# Patient Record
Sex: Female | Born: 1960 | Race: White | Hispanic: No | Smoking: Current every day smoker
Health system: Southern US, Community
[De-identification: ages and names within clinical notes are randomized; demographics above are authoritative.]

## PROBLEM LIST (undated history)

## (undated) DIAGNOSIS — G35 Multiple sclerosis: Secondary | ICD-10-CM

## (undated) DIAGNOSIS — I10 Essential (primary) hypertension: Secondary | ICD-10-CM

## (undated) DIAGNOSIS — G43909 Migraine, unspecified, not intractable, without status migrainosus: Secondary | ICD-10-CM

## (undated) DIAGNOSIS — E785 Hyperlipidemia, unspecified: Secondary | ICD-10-CM

## (undated) HISTORY — DX: Multiple sclerosis: G35

## (undated) HISTORY — DX: Essential (primary) hypertension: I10

## (undated) HISTORY — DX: Migraine, unspecified, not intractable, without status migrainosus: G43.909

## (undated) HISTORY — DX: Hyperlipidemia, unspecified: E78.5

---

## 2019-01-13 ENCOUNTER — Ambulatory Visit (INDEPENDENT_AMBULATORY_CARE_PROVIDER_SITE_OTHER): Payer: Medicare Other | Admitting: Neurology

## 2019-01-13 ENCOUNTER — Telehealth: Payer: Self-pay | Admitting: Neurology

## 2019-01-13 ENCOUNTER — Encounter: Payer: Self-pay | Admitting: Neurology

## 2019-01-13 ENCOUNTER — Other Ambulatory Visit: Payer: Self-pay

## 2019-01-13 VITALS — BP 101/60 | HR 58 | Temp 96.4°F | Ht 62.0 in | Wt 189.5 lb

## 2019-01-13 DIAGNOSIS — R5383 Other fatigue: Secondary | ICD-10-CM | POA: Diagnosis not present

## 2019-01-13 DIAGNOSIS — I10 Essential (primary) hypertension: Secondary | ICD-10-CM | POA: Diagnosis not present

## 2019-01-13 DIAGNOSIS — F418 Other specified anxiety disorders: Secondary | ICD-10-CM

## 2019-01-13 DIAGNOSIS — I639 Cerebral infarction, unspecified: Secondary | ICD-10-CM | POA: Insufficient documentation

## 2019-01-13 DIAGNOSIS — G35 Multiple sclerosis: Secondary | ICD-10-CM | POA: Diagnosis not present

## 2019-01-13 DIAGNOSIS — N3941 Urge incontinence: Secondary | ICD-10-CM

## 2019-01-13 MED ORDER — MODAFINIL 200 MG PO TABS: 200.0000 mg | ORAL_TABLET | Freq: Two times a day (BID) | ORAL | 5 refills | Status: DC

## 2019-01-13 MED ORDER — SOLIFENACIN SUCCINATE 5 MG PO TABS: 5.0000 mg | ORAL_TABLET | Freq: Every day | ORAL | 11 refills | Status: DC

## 2019-01-13 MED ORDER — DULOXETINE HCL 60 MG PO CPEP: 60.0000 mg | ORAL_CAPSULE | Freq: Every day | ORAL | 11 refills | Status: DC

## 2019-01-13 NOTE — Progress Notes (Signed)
[facility]  PATIENT: Yvonne LoreSuzanne Hubner DOB: 03/26/1961  REFERRING DOCTOR OR PCP:  Erlene SentersKenneth Osborn (fax: [phone]) SOURCE: patient, notes from Dr. Arline AspKinkel, will review MRI if available  _________________________________   HISTORICAL  CHIEF COMPLAINT:  Chief Complaint  Patient presents with   New Patient (Initial Visit)    RM 13, alone. Paper referral for MS. She is from [place]. She was dx with MS about 14 years ago. Not on DMT. She was on copaxone. She was on it for several years but had breathing issues on medication. She stopped d/t not being symptomatic and her neurologist in [place] had her stop medication.     HISTORY OF PRESENT ILLNESS:  I had the pleasure of seeing patient, Yvonne Page, [facility] for neurologic consultation regarding her diagnosis of multiple sclerosis.  Yvonne LoreSuzanne Outman is a 58 yo woman who was diagnosed with MS about 2006.   She reports at age 89 (1988), she had optic neuritis but the MRI of the brain reportedly showed only one spot.   She was told she had a 2/3 chance of developing MS.    Around 2006, she had vertigo.   An MRI was performed showing more foci and she was diagnosed with MS. she was referred to Dr. Arline AspKinkel.   She was on Copaxone for several years but stopped due to shortness of breath.   She has not been on any DMT for the past 7 years or so.   During that time, she has had some intermittent symptoms of numbness, never lasting over 1 day.  Additionally she has noted fatigue has worsened and her gait is sometimes off balance.  She was told around 7 years ago that she had a stroke on the MRI.   However, she never had any clear sudden onset symptom.  [place] moved to [place] in November [place].    [place] between [place] and [place].  Currently, she is walking without any aid.   Strength is fine though the wrist sometime seem weak.   She has some intermittent numbness in  her limbs but no fixed numbness.   She reports reduced vision OS since the optic neuritis.   She also notes color desaturation OS.   She continues to report dizziness.   She reports urinary urge incontinence at times.  She has depression and is on fluoxetine.   She has been on a different antidepressant in the past that worked better but does not recall the name.   She has fatigue daily and it is both physical and cognitive.    She is on modafinil 200 mg daily.    In the past, she was on 200 mg twice daily with better results.   She reports reduced cognition issues with reduced word finding and short term memory.     She reports pain in her elbows and wrists.  Pain can be severe at times.   It fluctuates.    Vascular risks:  Hypertension.  Smokes 1/2 ppd, Hyperlipidemia.   She does not recall having an Echo or carotid doppler.  She takes an 81 mg aspirin daily  MRI of the brain and cervical spine performed 11/11/2014 were personally reviewed.  The MRI of the brain shows a left temporal parietal stroke involving gray and white matter.  This could be embolic in origin.  Additionally, there is a second stroke adjacent to the caudate the corona radiata on  the left.  There are a few T2/flair hyperintense foci, one is juxtacortical on the left and others are subcortical deep white matter or pericallosal.  There are no foci in the posterior fossa or the spinal cord on the cervical spine MRI.  However, the cervical spine MRI does show mild spinal stenosis at C5-C6 associated with moderately severe foraminal narrowing.  REVIEW OF SYSTEMS: Constitutional: No fevers, chills, sweats, or change in appetite.  She has fatigue Eyes: No visual changes, double vision, eye pain Ear, nose and throat: No hearing loss, ear pain, nasal congestion, sore throat.  She has dizziness/vertigo. Cardiovascular: No chest pain, palpitations Respiratory: No shortness of breath at rest or with exertion.   No wheezes GastrointestinaI:  No nausea, vomiting, diarrhea, abdominal pain, fecal incontinence Genitourinary: No dysuria, urinary retention or frequency.  No nocturia. Musculoskeletal: No neck pain, back pain Integumentary: No rash, pruritus, skin lesions Neurological: as above Psychiatric: No depression at this time.  No anxiety Endocrine: No palpitations, diaphoresis, change in appetite, change in weigh or increased thirst Hematologic/Lymphatic: No anemia, purpura, petechiae. Allergic/Immunologic: No itchy/runny eyes, nasal congestion, recent allergic reactions, rashes  ALLERGIES: Not on File  HOME MEDICATIONS:  Current Outpatient Medications:    ASPIRIN 81 PO, Take 1 tablet by mouth daily., Disp: , Rfl:    atorvastatin (LIPITOR) 20 MG tablet, Take 20 mg by mouth daily., Disp: , Rfl:    Cholecalciferol (VITAMIN D3 PO), Take 5,000 Units by mouth daily., Disp: , Rfl:    FLUoxetine (PROZAC) 20 MG capsule, Take 1 capsule by mouth daily., Disp: , Rfl:    lisinopril (ZESTRIL) 20 MG tablet, 1 TABLET IN AM AND 1/2 TAB AT BEDTIME DAILY ORALLY 90, Disp: , Rfl:    modafinil (PROVIGIL) 200 MG tablet, Take 200 mg by mouth 2 (two) times a day. , Disp: , Rfl:    propranolol (INDERAL) 80 MG tablet, Take 80 mg by mouth 2 (two) times daily., Disp: , Rfl:   PAST MEDICAL HISTORY: Past Medical History:  Diagnosis Date   Multiple sclerosis (HCC)     PAST SURGICAL HISTORY:  FAMILY HISTORY: No family history on file.  SOCIAL HISTORY:  Social History   Socioeconomic History   Marital status: Divorced    Spouse name: Not on file   Number of children: Not on file   Years of education: Not on file   Highest education level: Not on file  Occupational History   Not on file  Social Needs   Financial resource strain: Not on file   Food insecurity    Worry: Not on file    Inability: Not on file   Transportation needs    Medical: Not on file    Non-medical: Not on file  Tobacco Use   Smoking status:  Not on file  Substance and Sexual Activity   Alcohol use: Not on file   Drug use: Not on file   Sexual activity: Not on file  Lifestyle   Physical activity    Days per week: Not on file    Minutes per session: Not on file   Stress: Not on file  Relationships   Social connections    Talks on phone: Not on file    Gets together: Not on file    Attends religious service: Not on file    Active member of club or organization: Not on file    Attends meetings of clubs or organizations: Not on file    Relationship status: Not on  file   Intimate partner violence    Fear of current or ex partner: Not on file    Emotionally abused: Not on file    Physically abused: Not on file    Forced sexual activity: Not on file  Other Topics Concern   Not on file  Social History Narrative   Lives alone   Caffeine use: 2 cups every morning (coffee)   Right handed     PHYSICAL EXAM  Vitals:   01/13/19 0913  BP: 101/60  Pulse: (!) 58  Temp: (!) 96.4 F (35.8 C)  SpO2: 98%  Weight: 189 lb 8 oz (86 kg)  Height: 5\' 2"  (1.575 m)    Body mass index is 34.66 kg/m.   General: The patient is well-developed and well-nourished and in no acute distress  HEENT:  Head is Sellers/AT.  Sclera are anicteric.  Funduscopic exam shows normal optic discs and retinal vessels.  Neck: No carotid bruits are noted.  The neck is nontender.  Cardiovascular: The heart has a regular rate and rhythm with a normal S1 and S2. There were no murmurs, gallops or rubs.    Skin: Extremities are without rash or  edema.  Musculoskeletal:  Back is nontender  Neurologic Exam  Mental status: The patient is alert and oriented x 3 at the time of the examination. The patient has apparent normal recent and remote memory, with an apparently normal attention span and concentration ability.   Speech is normal.  Cranial nerves: Extraocular movements are full. Pupils are equal, round, and reactive to light and accomodation.   Color vision was slightly brighter on the right than the left..  Facial symmetry is present. There is good facial sensation to soft touch bilaterally.Facial strength is normal.  Trapezius and sternocleidomastoid strength is normal. No dysarthria is noted.  The tongue is midline, and the patient has symmetric elevation of the soft palate. No obvious hearing deficits are noted.  Motor:  Muscle bulk is normal.   Tone is normal. Strength is  5 / 5 in all 4 extremities.   Sensory: Sensory testing is reduced to touch and temperature on the left vs right.  Coordination: Cerebellar testing reveals good finger-nose-finger and heel-to-shin bilaterally.  Gait and station: Station is normal.   Gait is normal. Tandem gait is mildly wide.. Romberg is negative.   Reflexes: Deep tendon reflexes are symmetric and normal bilaterally.   Plantar responses are flexor.     ASSESSMENT AND PLAN  1. Multiple sclerosis (HCC)   2. Cerebrovascular accident (CVA), unspecified mechanism (HCC)   3. Essential hypertension   4. Other fatigue   5. Urge incontinence   6. Depression with anxiety     In summary, Ms. Roque is a 58 year old woman who was diagnosed with MS 13 or 14 years ago who has not been on any disease modifying therapy for many years.  I was able to review the MRI from 2016 but did not have earlier MRIs for comparison.  I am concerned that the MRI of the brain shows 2 strokes including a moderately large right temporoparietal stroke that could have an embolic etiology.  There is also a larger lacunar type stroke adjacent to the caudate nucleus in the white matter on the left.  There are several other lesions that are nonspecific and could represent demyelination from MS but could also be due to chronic microvascular ischemic change.  Given her history of optic neuritis and some foci on the MRI that could be  MS, it is probable that she has a mild multiple sclerosis.  She has not had a definite exacerbation  for many years off of her medication.  I am more concerned about the 2 strokes and further evaluation is necessary to determine if there is a source.  We will check an echocardiogram with bubble contrast to evaluate the valves and to determine if there is a significant PFO.  Additionally, carotid Doppler studies will be performed to rule out carotid stenosis.  She is advised to continue 81 mg aspirin daily.  We will check an MRI of the brain and cervical spine to determine if she has had additional lesions since 2016.  If this has occurred and is consistent with subclinical progression of the MS, we will consider a disease modifying therapy.  Otherwise, if the MRI is stable over 4 years, we could continue off of a disease modifying therapy as I am not certain about the diagnosis.  She will follow-up to see me in 3 months or sooner if there are new or worsening neurologic symptoms.  For her symptoms, I will increase the modafinil to 200 mg p.o. twice a day.  She had been on this dose in the past with better benefit.  Additionally I will stop the fluoxetine and switch her to duloxetine to see if we can get better control of the mood issues.  Vesicare 5 mg will be added for her bladder urgency.  Thank you for asking Ms. Lamke to see me.  Please let me know if I can be of further assistance with her or other patients in the future.  Keyundra Fant A. Felecia Shelling, MD, PhD, FAAN Certified in Neurology, Clinical Neurophysiology, Sleep Medicine, Pain Medicine and Neuroimaging Director, [place] at [facility] [address] [place], [place] [address] [phone]

## 2019-01-13 NOTE — Telephone Encounter (Signed)
[employer] medicare order sent to GI. No auth they will reach out to the patient to schedule.  

## 2019-01-16 ENCOUNTER — Other Ambulatory Visit: Payer: Self-pay | Admitting: *Deleted

## 2019-01-16 DIAGNOSIS — I639 Cerebral infarction, unspecified: Secondary | ICD-10-CM

## 2019-01-21 ENCOUNTER — Ambulatory Visit (HOSPITAL_COMMUNITY): Payer: Medicare Other | Attending: Cardiology

## 2019-01-21 ENCOUNTER — Other Ambulatory Visit: Payer: Self-pay

## 2019-01-21 DIAGNOSIS — I639 Cerebral infarction, unspecified: Secondary | ICD-10-CM

## 2019-01-21 DIAGNOSIS — I10 Essential (primary) hypertension: Secondary | ICD-10-CM

## 2019-01-21 DIAGNOSIS — F17200 Nicotine dependence, unspecified, uncomplicated: Secondary | ICD-10-CM | POA: Diagnosis not present

## 2019-01-21 DIAGNOSIS — I081 Rheumatic disorders of both mitral and tricuspid valves: Secondary | ICD-10-CM | POA: Insufficient documentation

## 2019-01-21 DIAGNOSIS — E785 Hyperlipidemia, unspecified: Secondary | ICD-10-CM | POA: Insufficient documentation

## 2019-01-21 DIAGNOSIS — G35 Multiple sclerosis: Secondary | ICD-10-CM | POA: Insufficient documentation

## 2019-01-22 ENCOUNTER — Telehealth: Payer: Self-pay | Admitting: *Deleted

## 2019-01-22 DIAGNOSIS — I7789 Other specified disorders of arteries and arterioles: Secondary | ICD-10-CM

## 2019-01-22 NOTE — Telephone Encounter (Signed)
Called and spoke with pt about results per Dr. Felecia Shelling note. She has no preference of who to be referred to. I placed referral. Asked pt to call back if she does not hear about scheduling appt in the next week or so. She verbalized understanding. She is going to carotid doppler tomorrow and aware we will call with results once they come back

## 2019-01-22 NOTE — Telephone Encounter (Signed)
-----   Message from Britt Bottom, MD sent at 01/21/2019  3:45 PM EDT ----- The heart looked normal and there was no shunt from right to left that would lead to risk of stroke.   However, the aorta was mildly enlarged.   This would probably not cause any symptom but I would like her to see vascular surgery as they may need to follow this to make sure it doesn't enlarge further.  "57 year old woman with mildly enlarged ascending aorta (38 mm)"

## 2019-01-23 ENCOUNTER — Other Ambulatory Visit: Payer: Self-pay

## 2019-01-23 ENCOUNTER — Ambulatory Visit (HOSPITAL_COMMUNITY)
Admission: RE | Admit: 2019-01-23 | Discharge: 2019-01-23 | Disposition: A | Discharge status: Home / Self Care | Payer: Medicare Other | Source: Ambulatory Visit | Attending: Neurology | Admitting: Neurology

## 2019-01-23 DIAGNOSIS — I639 Cerebral infarction, unspecified: Secondary | ICD-10-CM | POA: Diagnosis not present

## 2019-01-27 ENCOUNTER — Telehealth: Payer: Self-pay

## 2019-01-27 NOTE — Telephone Encounter (Signed)
I called pt about her vas carotid ultrasound results. I stated the ultrasound was normal. Pt verbalized understanding.  ------

## 2019-02-05 ENCOUNTER — Other Ambulatory Visit: Payer: Self-pay | Admitting: Neurology

## 2019-02-06 ENCOUNTER — Telehealth: Payer: Self-pay | Admitting: Neurology

## 2019-02-06 DIAGNOSIS — I7789 Other specified disorders of arteries and arterioles: Secondary | ICD-10-CM

## 2019-02-06 NOTE — Addendum Note (Signed)
Addended by: Hope Pigeon on: 02/06/2019 02:12 PM   Modules accepted: Orders

## 2019-02-06 NOTE — Telephone Encounter (Signed)
Placed new referral.

## 2019-02-06 NOTE — Telephone Encounter (Signed)
Please advise   Maness-Harrison, Bard Herbert, CMA  [facility], Dana R        [facility] does not treat or evaluate ascending aortic aneurysms. This referral is most appropriate for [facility]. This referral will be closed.   Thank you.  Chanda

## 2019-02-08 ENCOUNTER — Ambulatory Visit
Admission: RE | Admit: 2019-02-08 | Discharge: 2019-02-08 | Disposition: A | Discharge status: Home / Self Care | Payer: Medicare Other | Source: Ambulatory Visit | Attending: Neurology | Admitting: Neurology

## 2019-02-08 ENCOUNTER — Other Ambulatory Visit: Payer: Self-pay

## 2019-02-08 DIAGNOSIS — G35 Multiple sclerosis: Secondary | ICD-10-CM | POA: Diagnosis not present

## 2019-02-10 ENCOUNTER — Telehealth: Payer: Self-pay | Admitting: Neurology

## 2019-02-10 DIAGNOSIS — R000 Tachycardia, unspecified: Secondary | ICD-10-CM

## 2019-02-10 DIAGNOSIS — I639 Cerebral infarction, unspecified: Secondary | ICD-10-CM

## 2019-02-10 NOTE — Telephone Encounter (Signed)
I tried to call about the results of the MRIs but got voicemail and the mailbox was full.  The MRI of the brain does not show anything new compared to the 2016 MRI.  It does show a couple of strokes.  As she has no new lesions despite not being on any medication for MS, it is more likely that she does not have MS.  I believe some of her symptoms in the past were from the strokes.  She should continue to take the aspirin.  The MRI of the cervical spine does show mild spinal stenosis and potential for nerve root compression at several levels.   2 of the 5 levels with disc changes and arthritis are worse on the current MRI compared to the 2016 MRI.  The pain in the arms is likely from these degenerative changes.  I would like to start gabapentin 300 mg 3 times a day to help with pain coming from pinched nerves.

## 2019-02-11 ENCOUNTER — Telehealth: Payer: Self-pay | Admitting: *Deleted

## 2019-02-11 MED ORDER — GABAPENTIN 300 MG PO CAPS: 300.0000 mg | ORAL_CAPSULE | Freq: Three times a day (TID) | ORAL | 11 refills | Status: DC

## 2019-02-11 NOTE — Telephone Encounter (Signed)
Dr. Felecia Shelling- did you recently try to call her? I already spoke with her about results

## 2019-02-11 NOTE — Telephone Encounter (Signed)
Pt returning Dr Felecia Shelling call. Please call [phone]

## 2019-02-11 NOTE — Telephone Encounter (Signed)
Okay, I already spoke with pt today.

## 2019-02-11 NOTE — Telephone Encounter (Signed)
I called last night and when she didn't answer I wrote a message for you to try to reach her today

## 2019-02-11 NOTE — Telephone Encounter (Signed)
Called and spoke with pt. Relayed results per Dr. Felecia Shelling note. She is agreeable to try gabapentin for pain. I e-scribed to pharmacy on file. She will keep f/u for 04/2019 with Dr. Felecia Shelling and discuss results further then with Dr. Felecia Shelling.   She asked if records were received from previous neurologist, Dr. Rosendo Gros. Advised I will forward message to Angela Nevin. In our medical records department to f/u on this.

## 2019-02-11 NOTE — Telephone Encounter (Signed)
Tried pt back but VM full. If she calls, let her know message from Dr. Felecia Shelling was from last night. I spoke with her today about results. Nothing further needed.

## 2019-02-12 ENCOUNTER — Telehealth: Payer: Self-pay | Admitting: *Deleted

## 2019-02-12 NOTE — Telephone Encounter (Signed)
I spoke to Yvonne Page about the MRI results.  She clearly has a moderately large right temporoparietal stroke and several lacunar strokes in and around the basal ganglia on the left and in the centrum semiovale.  There are also some nonspecific foci that are much more likely to be chronic ischemic given the appearance of the rest of the brain though I cannot rule out demyelination.  Therefore, I think it is more likely that her neurologic symptoms over the years have been due to her strokes and not to MS.  The echocardiogram with bubble contrast and carotid Dopplers were essentially normal.  She reports that in the past she has had rapid heart rate that was treated with Inderal.  She continues on Inderal.  I will check a 48-hour Holter monitor to determine if she has paroxysmal A. fib.  She should continue on the aspirin for now.

## 2019-02-12 NOTE — Telephone Encounter (Signed)
Pt called, request a call  from Dr Felecia Shelling after he review her notes from Dr Rosendo Gros office. Please call [phone]

## 2019-02-14 ENCOUNTER — Telehealth: Payer: Self-pay | Admitting: Radiology

## 2019-02-14 NOTE — Telephone Encounter (Signed)
Attempted to reach patient to verify address and briefly go over the monitor that was ordered before having it mailed. Mailbox full and unable to leave message.

## 2019-02-25 ENCOUNTER — Other Ambulatory Visit: Payer: Self-pay | Admitting: Neurology

## 2019-02-25 DIAGNOSIS — I77810 Thoracic aortic ectasia: Secondary | ICD-10-CM

## 2019-02-26 ENCOUNTER — Telehealth: Payer: Self-pay | Admitting: *Deleted

## 2019-02-26 ENCOUNTER — Telehealth: Payer: Self-pay | Admitting: Neurology

## 2019-02-26 NOTE — Telephone Encounter (Signed)
3-14 day ZIO XT long term holter monitor to be mailed to patients home.  Instructions reviewed briefly as they are included in the monitor kit.

## 2019-02-26 NOTE — Telephone Encounter (Signed)
Pt called wanting to speak to the RN about a Heart Halter she was advised to get to check for Afib. Pt states that her father had Afib. Please advise.

## 2019-02-26 NOTE — Telephone Encounter (Signed)
Called pt back. She is having severe leg cramps since starting gabapentin. Only happens at night after taking second dose of gabapentin. Episodes last 45-60 min. Sx resolve by the time she wakes up.  She also has not heard about scheduling appt to get holter montior. Advised her to call [facility] at [phone] to get this set up. She relayed that Father has Afib as well.   She also asked about referral to [facility]. She received call today and scheduled appt 03/14/19 at 12:30pm for enlarged aorta. Verified Dr. Felecia Shelling placed this and she should keep appt. She verbalized understanding.

## 2019-03-06 ENCOUNTER — Ambulatory Visit (INDEPENDENT_AMBULATORY_CARE_PROVIDER_SITE_OTHER): Payer: Medicare Other

## 2019-03-06 DIAGNOSIS — I639 Cerebral infarction, unspecified: Secondary | ICD-10-CM

## 2019-03-06 DIAGNOSIS — R000 Tachycardia, unspecified: Secondary | ICD-10-CM | POA: Diagnosis not present

## 2019-03-14 ENCOUNTER — Encounter: Payer: Medicare Other | Admitting: Cardiothoracic Surgery

## 2019-03-18 ENCOUNTER — Other Ambulatory Visit: Payer: Self-pay | Admitting: Neurology

## 2019-03-18 MED ORDER — LAMOTRIGINE 100 MG PO TABS: ORAL_TABLET | ORAL | 5 refills | Status: DC

## 2019-03-18 NOTE — Telephone Encounter (Signed)
Called patient to make sure she was scheduled for her apt .  Patient stated since she has been taking Gabapentin she has been more aggressive and she states she does feels as well she likes the way she felt when she was just on her antidepressant.  Patient wants something that works well with her antidepressant.

## 2019-03-18 NOTE — Telephone Encounter (Signed)
Dr. Sater- please advise 

## 2019-03-18 NOTE — Telephone Encounter (Signed)
Per Dr. Felecia Shelling, she can take gabapentin 300mg  po qhs for a few days and then stop while starting on lamotrigine

## 2019-03-18 NOTE — Telephone Encounter (Signed)
We can have her start lamotrigine and titrate up.   I will send in a script  The pharmacy has the prescription for lamotrigine 100 mg tablets. For 5 days, just take one half pill a day. For the next 5 days, take one half pill twice a day. For the next 5 days, take one half pill 3 times a day Then start taking one pill twice a day from this point on.    In the future, we may increase the dose further.  If you get a rash, need to stop the medication and not take it again.

## 2019-03-18 NOTE — Telephone Encounter (Signed)
Called pt and relayed directions per Dr. Felecia Shelling note. She read back directions correctly. Advised MD called in rx. She will pick up today. She will call back if she has further questions/concerns.

## 2019-03-20 NOTE — Progress Notes (Signed)
[address]       [place] [address]             [phone]        Yvonne Page Haakon Medical Record #[id number] Date of Birth: Dec 23, 1960  Referring: Britt Bottom, MD Primary Care: [facility] Primary Cardiologist:No primary care provider on file.  Chief Complaint:   No chief complaint on file.   History of Present Illness:     58 yo female with multiple sclerosis presents for evaluation of a 3.8cm ascending aortic aneurysm seen on echo.  She is currently being evaluated for her MS by neurology, and has multiple concerns from that.  She continues to smoke about 1/2ppd.     Past Medical History:  Diagnosis Date  . Multiple sclerosis ([place])     No past surgical history on file.  Social History: Support: spends half of the year in [place].  Currently in [place] [place] with her son  Social History   Tobacco Use  Smoking Status Not on file    Social History   Substance and Sexual Activity  Alcohol Use Not on file     Not on File   Current Outpatient Medications  Medication Sig Dispense Refill  . ASPIRIN 81 PO Take 1 tablet by mouth daily.    Marland Kitchen atorvastatin (LIPITOR) 20 MG tablet Take 20 mg by mouth daily.    . Cholecalciferol (VITAMIN D3 PO) Take 5,000 Units by mouth daily.    . DULoxetine (CYMBALTA) 60 MG capsule TAKE 1 CAPSULE BY MOUTH EVERY DAY 90 capsule 3  . FLUoxetine (PROZAC) 20 MG capsule Take 1 capsule by mouth daily.    Marland Kitchen lamoTRIgine (LAMICTAL) 100 MG tablet Take 0.5 pill qd x 5 days, then 0.5 pills bid x 5 days, then 0.5 pills tid x 5 days, then 1 pill po bid.  Stop if rash develops. 60 tablet 5  . lisinopril (ZESTRIL) 20 MG tablet 1 TABLET IN AM AND 1/2 TAB AT BEDTIME DAILY ORALLY 90    . modafinil (PROVIGIL) 200 MG tablet Take 1 tablet (200 mg total) by mouth 2 (two) times a day. 60 tablet 5  . propranolol (INDERAL) 80 MG tablet Take 80 mg by mouth 2 (two) times daily.    . solifenacin (VESICARE) 5 MG tablet Take 1  tablet (5 mg total) by mouth daily. 30 tablet 11   No current facility-administered medications for this visit.     (Not in a hospital admission)   No family history on file.   Review of Systems:   Review of Systems  Constitutional: Negative.   HENT: Negative.   Eyes: Negative.   Respiratory: Positive for shortness of breath.   Cardiovascular: Positive for palpitations.  Gastrointestinal: Negative.   Musculoskeletal: Positive for joint pain and myalgias.  Skin: Negative.   Neurological: Positive for dizziness.      Physical Exam: There were no vitals taken for this visit. Physical Exam  Constitutional: She is oriented to person, place, and time and well-developed, well-nourished, and in no distress. No distress.  HENT:  Head: Normocephalic and atraumatic.  Eyes: Conjunctivae and EOM are normal.  Neck: Normal range of motion. No tracheal deviation present.  Cardiovascular: Normal rate and regular rhythm.  No murmur heard. Pulmonary/Chest: Effort normal. No respiratory distress.  Abdominal: Soft. She exhibits no distension.  Musculoskeletal: Normal range of motion.  Neurological: She is alert and oriented to person, place, and time.  Skin: Skin is warm and dry. She is not diaphoretic.      Diagnostic Studies & Laboratory data:     Echo:  IMPRESSIONS    1. The left ventricle has normal systolic function with an ejection fraction of 60-65%. The cavity size was normal. Left ventricular diastolic parameters were normal.  2. The right ventricle has normal systolic function. The cavity was normal. There is no increase in right ventricular wall thickness. Right ventricular systolic pressure is normal with an estimated pressure of 30.6 mmHg.  3. There is mild dilatation of the ascending aorta measuring 38 mm.   I have independently reviewed the above radiologic studies and discussed with the patient   Recent Lab Findings: No results found for: WBC, HGB, HCT, PLT,  GLUCOSE, CHOL, TRIG, HDL, LDLDIRECT, LDLCALC, ALT, AST, NA, K, CL, CREATININE, BUN, CO2, TSH, INR, GLUF, HGBA1C    Assessment / Plan:   58 yo female with 3.8cm ascending aortic aneurysm seen on echo Will scheduled for formal CTA in 6 months Recommendations and precautions explained in detail - smoking cessation, blood pressure control, cholesterol control     I  spent 30 minutes counseling the patient face to face.   Corliss Skains 03/20/2019 1:06 PM

## 2019-03-21 ENCOUNTER — Other Ambulatory Visit: Payer: Self-pay

## 2019-03-21 ENCOUNTER — Institutional Professional Consult (permissible substitution) (INDEPENDENT_AMBULATORY_CARE_PROVIDER_SITE_OTHER): Payer: Medicare Other | Admitting: Thoracic Surgery (Cardiothoracic Vascular Surgery)

## 2019-03-21 ENCOUNTER — Encounter: Payer: Medicare Other | Admitting: Cardiothoracic Surgery

## 2019-03-21 ENCOUNTER — Encounter: Payer: Self-pay | Admitting: Thoracic Surgery (Cardiothoracic Vascular Surgery)

## 2019-03-21 DIAGNOSIS — I712 Thoracic aortic aneurysm, without rupture, unspecified: Secondary | ICD-10-CM

## 2019-03-24 ENCOUNTER — Telehealth: Payer: Self-pay | Admitting: Neurology

## 2019-03-24 NOTE — Telephone Encounter (Signed)
Ok for her to stop lamotrigine.   She can take Tylenol prn pain.  We don't write for opiate pain pills.

## 2019-03-24 NOTE — Telephone Encounter (Signed)
Called pt. Relayed Dr. Garth Bigness recommendation. She is agreeable to try Tylenol.

## 2019-03-24 NOTE — Telephone Encounter (Addendum)
Called and spoke with pt. She is taking 0.5 tablet of lamotrigine 100mg  qhs right now. She will stop it. She would prefer not to be on medicine like lamotrigine. Asking about pain medication she can take as needed for pain that occurs instead. She will start diary to document episodes as well.  She also relayed that she saw cardiac surgeon (Dr. Kipp Brood) this past Friday and scheduled her for CT scan in 6 months. Said findings mild and will continue to monitor her. If CT looks good in 6 months, he will do repeat CT scan yearly or so. Told her he was going to f/u with Dr. Felecia Shelling.

## 2019-03-24 NOTE — Telephone Encounter (Signed)
Pt called in and stated she took her lamoTRIgine (LAMICTAL) 100 MG tablet for the 3rd time and she was experiencing chest pain again, and she has also developed a rash in between her breast, she states they are starting to look blistered and also experiencing heart burn.

## 2019-04-07 ENCOUNTER — Telehealth: Payer: Self-pay | Admitting: *Deleted

## 2019-04-07 DIAGNOSIS — I498 Other specified cardiac arrhythmias: Secondary | ICD-10-CM

## 2019-04-07 DIAGNOSIS — I639 Cerebral infarction, unspecified: Secondary | ICD-10-CM

## 2019-04-07 NOTE — Telephone Encounter (Signed)
-----   Message from Britt Bottom, MD sent at 04/07/2019  3:55 PM EDT ----- Please let her know that the Holter monitor showed that she had a few times with a heart rate was very rapid at about 150 but these episodes were very short.  Therefore, they probably are not causing any symptoms.  However, because she has had some strokes I would like her to see cardiology so that they can evaluate her as well.  (Consult can be for stroke and arrhythmia)

## 2019-04-07 NOTE — Telephone Encounter (Signed)
Called and spoke with pt. She was at post office checking out. Advised I will call her back in about 5-1min  Called pt back, went to VM. Mailbox full, unable to LVM

## 2019-04-08 MED ORDER — MAXALT 10 MG PO TABS: ORAL_TABLET | ORAL | 0 refills | Status: DC

## 2019-04-08 NOTE — Telephone Encounter (Signed)
Per Dr. Felecia Shelling, ok for pt to wait to schedule cardiology appt until after she returns from [place]. I placed referral to cardiology.  He also approved to take over prescribing name brand maxalt. Insurance may not cover. If not, we can try her on generic eletriptan per Dr. Felecia Shelling. I escribed rx Maxalt to pharmacy.

## 2019-04-08 NOTE — Telephone Encounter (Signed)
Called pt back. Relayed Dr. Garth Bigness message. She verbalized understanding. She is driving to [place] this Friday and will be gone for 2-3 weeks. Will be back by 05/06/2019. She is wanting to know if its ok to make appt with cardiology after she gets back. I advised this will be okay as long as she is comfortable with this. They will call her to schedule appt in the next week or so.   When she was first dx with MS, she had severe migraines. Dr. Shirlee Limerick originally addressed migraines. She was on maxalt prn for migraines and she did well on this. Generic was ineffective per pt. She is wondering if Dr. Felecia Shelling will take over prescribing this for her. She has 3-4 tablets left. [facility]

## 2019-05-06 ENCOUNTER — Ambulatory Visit: Payer: Medicare Other | Admitting: Neurology

## 2019-05-15 ENCOUNTER — Ambulatory Visit: Payer: Medicare Other | Admitting: Neurology

## 2019-06-03 ENCOUNTER — Other Ambulatory Visit: Payer: Self-pay | Admitting: Neurology

## 2019-06-10 ENCOUNTER — Ambulatory Visit: Payer: Medicare Other | Admitting: Neurology

## 2019-06-18 ENCOUNTER — Other Ambulatory Visit: Payer: Self-pay | Admitting: *Deleted

## 2019-06-18 MED ORDER — MODAFINIL 200 MG PO TABS: 200.0000 mg | ORAL_TABLET | Freq: Two times a day (BID) | ORAL | 2 refills | Status: DC

## 2019-06-18 NOTE — Telephone Encounter (Signed)
Called pt because we received fax request for refill on [facility] [place]. Previously, she was supposed to return 04/2019. Pt states she has had to do repairs to her home in [place] and will be back beginning of March to [facility]. I scheduled f/u for her with Dr. Epimenio Foot 08/20/19 at 330pm.  Per Dr. Epimenio Foot, ok to send refill for modafinil until her appt.  [id number] [place] [place] - [address] and cx any remaninig refills for modafinil since we are sending to a different pharmacy. Spoke with [facility].  [place] drug registry showing she last refill modafinil 03/23/2019 #60.

## 2019-07-22 ENCOUNTER — Telehealth: Payer: Self-pay | Admitting: *Deleted

## 2019-07-22 NOTE — Telephone Encounter (Signed)
Received letter in mail from [employer] that PA needed for solifenacin 5mg  tab. I tried initiating PA on CMM with [employer]/Medicare insurance on file. Received the following response: "[facility] may be unable to respond with clinical questions for your request electronically. Please reach out to complete the request over the phone at [phone]."  I called pt to get updated insurance info for the new year. She had lapse in her SSDI. This is resolved now. She is currently working on getting her medical coverage updated. Prescription coverage the only thing that is active right now.  Her ID for rx coverage:  ID: [id number]. RxBin: [id number] , RxPCN:[id number],Rxgrp: [id number]. Phone: [phone].  I was able to submit PA on CMM with this new info. Key: [id number]. Waiting on determination from [employer].

## 2019-07-23 MED ORDER — TOLTERODINE TARTRATE ER 4 MG PO CP24: 4.0000 mg | ORAL_CAPSULE | Freq: Every day | ORAL | 11 refills | Status: DC

## 2019-07-23 NOTE — Telephone Encounter (Addendum)
Received fax from [employer] that PA solifenacin denied. Pt must try/fail 2 formulary options first: darifenacin, Myrbetriq, oxybutyin and tolterodine. I spoke with Dr. Epimenio Foot who recommended placing her on tolterodine 4mg  po qd instead.   I called pt. Relayed above info. She is agreeable to change to tolterodine and d/c solifenacin.

## 2019-07-23 NOTE — Addendum Note (Signed)
Addended by: Hillis Range on: 07/23/2019 08:31 AM   Modules accepted: Orders

## 2019-08-13 ENCOUNTER — Other Ambulatory Visit: Payer: Self-pay | Admitting: *Deleted

## 2019-08-13 ENCOUNTER — Telehealth: Payer: Self-pay | Admitting: Neurology

## 2019-08-13 MED ORDER — DULOXETINE HCL 60 MG PO CPEP: ORAL_CAPSULE | ORAL | 0 refills | Status: DC

## 2019-08-13 NOTE — Telephone Encounter (Signed)
Escribed refill. Pt has f/u 08/20/19

## 2019-08-13 NOTE — Telephone Encounter (Signed)
1) Medication(s) Requested (by name): DULoxetine (CYMBALTA) 60 MG capsule   2) [id number] [place]   Only has 1 left

## 2019-08-16 ENCOUNTER — Other Ambulatory Visit: Payer: Self-pay | Admitting: Neurology

## 2019-08-20 ENCOUNTER — Encounter: Payer: Self-pay | Admitting: Neurology

## 2019-08-20 ENCOUNTER — Telehealth: Payer: Self-pay | Admitting: Neurology

## 2019-08-20 ENCOUNTER — Other Ambulatory Visit: Payer: Self-pay | Admitting: Thoracic Surgery (Cardiothoracic Vascular Surgery)

## 2019-08-20 ENCOUNTER — Other Ambulatory Visit: Payer: Self-pay

## 2019-08-20 ENCOUNTER — Ambulatory Visit (INDEPENDENT_AMBULATORY_CARE_PROVIDER_SITE_OTHER): Payer: Self-pay | Admitting: Neurology

## 2019-08-20 VITALS — BP 110/72 | HR 89 | Temp 97.9°F | Ht 62.0 in | Wt 196.0 lb

## 2019-08-20 DIAGNOSIS — R9082 White matter disease, unspecified: Secondary | ICD-10-CM

## 2019-08-20 DIAGNOSIS — N3941 Urge incontinence: Secondary | ICD-10-CM

## 2019-08-20 DIAGNOSIS — I77810 Thoracic aortic ectasia: Secondary | ICD-10-CM

## 2019-08-20 DIAGNOSIS — I639 Cerebral infarction, unspecified: Secondary | ICD-10-CM

## 2019-08-20 DIAGNOSIS — R000 Tachycardia, unspecified: Secondary | ICD-10-CM

## 2019-08-20 DIAGNOSIS — R5383 Other fatigue: Secondary | ICD-10-CM

## 2019-08-20 DIAGNOSIS — F418 Other specified anxiety disorders: Secondary | ICD-10-CM

## 2019-08-20 DIAGNOSIS — I712 Thoracic aortic aneurysm, without rupture, unspecified: Secondary | ICD-10-CM

## 2019-08-20 MED ORDER — MAXALT 10 MG PO TABS: ORAL_TABLET | ORAL | 11 refills | Status: DC

## 2019-08-20 MED ORDER — SOLIFENACIN SUCCINATE 10 MG PO TABS: 10.0000 mg | ORAL_TABLET | Freq: Every day | ORAL | 3 refills | Status: DC

## 2019-08-20 NOTE — Progress Notes (Signed)
[facility]  PATIENT: Yvonne Page DOB: 1960-07-26  REFERRING DOCTOR OR PCP:  Erlene Senters (fax: [phone]) SOURCE: patient, notes from Dr. Arline Asp, will review MRI if available  _________________________________   HISTORICAL  CHIEF COMPLAINT:  Chief Complaint  Patient presents with  . Follow-up    RM 12, alone. Last seen 01/13/2019.     HISTORY OF PRESENT ILLNESS:  Yvonne Page is a 59 year old woman who had been diagnosed with MS in the past but has evidence of strokes on MRI.  Update 08/20/2019: She was back up [place] where she lives for most of the year but has come back in town for a few weeks.  She denies any new symptoms though continues to experience headaches, dizziness, visual changes and bladder difficulties.  She has migraine headaches.    Generic rizatriptan has not helped as much as brand Maxalt-MLT 10.   She would like a refill for the brand name   The Vesicare helped her bladder function more than Detrol and she would like to switch back.  After the last visit, we did several tests 02/08/2019 brian and cervical spine  MRI IMPRESSION: This MRI of the brain without contrast shows the following: 1.   Multiple strokes involving the right temporoparietal region, left basal ganglia and adjacent white matter and left corona radiata.  These are unchanged compared to the 2016 MRI.  These do not appear to be demyelinating. 2.   Additionally, there are some scattered T2/flair hyperintense foci in the deep white matter and left thalamus most consistent with mild chronic microvascular ischemic change.  Demyelination would be less likely though cannot be completely excluded. 3.   There are no acute findings.  Additionally, hemosiderin is noted within the left caudate head stroke.  The echocardiogram with bubble contrast 01/21/2019 and carotid Dopplers were essentially normal as far as the heart but the aorta was enlarged.        From  01/13/2019 Yvonne Page is a 59 yo woman who was diagnosed with MS about 2006.   She reports at age 77 (1988), she had optic neuritis but the MRI of the brain reportedly showed only one spot.   She was told she had a 2/3 chance of developing MS.    Around 2006, she had vertigo.   An MRI was performed showing more foci and she was diagnosed with MS. she was referred to Dr. Arline Asp.   She was on Copaxone for several years but stopped due to shortness of breath.   She has not been on any DMT for the past 7 years or so.   During that time, she has had some intermittent symptoms of numbness, never lasting over 1 day.  Additionally she has noted fatigue has worsened and her gait is sometimes off balance.  She was told around 7 years ago that she had a stroke on the MRI.   However, she never had any clear sudden onset symptom.  She moved to [place] in November 2019.    She is still splitting her time between [place] and [place].  Currently, she is walking without any aid.   Strength is fine though the wrist sometime seem weak.   She has some intermittent numbness in her limbs but no fixed numbness.   She reports reduced vision OS since the optic neuritis.   She also notes color desaturation OS.   She continues to report dizziness.   She reports urinary urge incontinence at times.  She has depression and is on fluoxetine.   She has been on a different antidepressant in the past that worked better but does not recall the name.   She has fatigue daily and it is both physical and cognitive.    She is on modafinil 200 mg daily.    In the past, she was on 200 mg twice daily with better results.   She reports reduced cognition issues with reduced word finding and short term memory.     She reports pain in her elbows and wrists.  Pain can be severe at times.   It fluctuates.    Vascular risks:  Hypertension.  Smokes 1/2 ppd, Hyperlipidemia.   She does not recall having an Echo or carotid doppler.  She takes  an 81 mg aspirin daily  MRI of the brain and cervical spine performed 11/11/2014 were personally reviewed.  The MRI of the brain shows a left temporal parietal stroke involving gray and white matter.  This could be embolic in origin.  Additionally, there is a second stroke adjacent to the caudate the corona radiata on the left.  There are a few T2/flair hyperintense foci, one is juxtacortical on the left and others are subcortical deep white matter or pericallosal.  There are no foci in the posterior fossa or the spinal cord on the cervical spine MRI.  However, the cervical spine MRI does show mild spinal stenosis at C5-C6 associated with moderately severe foraminal narrowing.  REVIEW OF SYSTEMS: Constitutional: No fevers, chills, sweats, or change in appetite.  She has fatigue Eyes: No visual changes, double vision, eye pain Ear, nose and throat: No hearing loss, ear pain, nasal congestion, sore throat.  She has dizziness/vertigo. Cardiovascular: No chest pain, palpitations Respiratory: No shortness of breath at rest or with exertion.   No wheezes GastrointestinaI: No nausea, vomiting, diarrhea, abdominal pain, fecal incontinence Genitourinary: No dysuria, urinary retention or frequency.  No nocturia. Musculoskeletal: No neck pain, back pain Integumentary: No rash, pruritus, skin lesions Neurological: as above Psychiatric: No depression at this time.  No anxiety Endocrine: No palpitations, diaphoresis, change in appetite, change in weigh or increased thirst Hematologic/Lymphatic: No anemia, purpura, petechiae. Allergic/Immunologic: No itchy/runny eyes, nasal congestion, recent allergic reactions, rashes  ALLERGIES: Allergies  Allergen Reactions  . Gabapentin     Severe leg cramps  . Lamotrigine Rash    Chest pain    HOME MEDICATIONS:  Current Outpatient Medications:  .  acetaminophen (TYLENOL) 325 MG tablet, Take 650 mg by mouth every 6 (six) hours as needed., Disp: , Rfl:  .   ASPIRIN 81 PO, Take 1 tablet by mouth daily., Disp: , Rfl:  .  atorvastatin (LIPITOR) 20 MG tablet, Take 20 mg by mouth daily., Disp: , Rfl:  .  Cholecalciferol (VITAMIN D3 PO), Take 5,000 Units by mouth daily., Disp: , Rfl:  .  DULoxetine (CYMBALTA) 60 MG capsule, TAKE 1 CAPSULE BY MOUTH EVERY DAY, Disp: 90 capsule, Rfl: 0 .  lisinopril (ZESTRIL) 20 MG tablet, 1 TABLET IN AM AND 1/2 TAB AT BEDTIME DAILY ORALLY 90, Disp: , Rfl:  .  MAXALT 10 MG tablet, Take 1 tablet at the onset of migraine. May repeat once in 2 hr if needed. No more than 2 tablets in 24 hours., Disp: 10 tablet, Rfl: 11 .  modafinil (PROVIGIL) 200 MG tablet, Take 1 tablet (200 mg total) by mouth 2 (two) times daily., Disp: 60 tablet, Rfl: 2 .  propranolol (INDERAL) 80 MG tablet, Take 80  mg by mouth 2 (two) times daily., Disp: , Rfl:  .  solifenacin (VESICARE) 10 MG tablet, Take 1 tablet (10 mg total) by mouth daily., Disp: 90 tablet, Rfl: 3 .  tolterodine (DETROL LA) 4 MG 24 hr capsule, TAKE 1 CAPSULE BY MOUTH EVERY DAY, Disp: 90 capsule, Rfl: 1  PAST MEDICAL HISTORY: Past Medical History:  Diagnosis Date  . Multiple sclerosis ([place])     PAST SURGICAL HISTORY:  FAMILY HISTORY: No family history on file.  SOCIAL HISTORY:  Social History   Socioeconomic History  . Marital status: Divorced    Spouse name: Not on file  . Number of children: Not on file  . Years of education: Not on file  . Highest education level: Not on file  Occupational History  . Not on file  Tobacco Use  . Smoking status: Not on file  Substance and Sexual Activity  . Alcohol use: Not on file  . Drug use: Not on file  . Sexual activity: Not on file  Other Topics Concern  . Not on file  Social History Narrative   Lives alone   Caffeine use: 2 cups every morning (coffee)   Right handed   Social Determinants of Health   Financial Resource Strain:   . Difficulty of Paying Living Expenses: Not on file  Food Insecurity:   . Worried About  Charity fundraiser in the Last Year: Not on file  . Ran Out of Food in the Last Year: Not on file  Transportation Needs:   . Lack of Transportation (Medical): Not on file  . Lack of Transportation (Non-Medical): Not on file  Physical Activity:   . Days of Exercise per Week: Not on file  . Minutes of Exercise per Session: Not on file  Stress:   . Feeling of Stress : Not on file  Social Connections:   . Frequency of Communication with Friends and Family: Not on file  . Frequency of Social Gatherings with Friends and Family: Not on file  . Attends Religious Services: Not on file  . Active Member of Clubs or Organizations: Not on file  . Attends Archivist Meetings: Not on file  . Marital Status: Not on file  Intimate Partner Violence:   . Fear of Current or Ex-Partner: Not on file  . Emotionally Abused: Not on file  . Physically Abused: Not on file  . Sexually Abused: Not on file     PHYSICAL EXAM  Vitals:   08/20/19 1532  BP: 110/72  Pulse: 89  Temp: 97.9 F (36.6 C)  Weight: 196 lb (88.9 kg)  Height: 5\' 2"  (1.575 m)    Body mass index is 35.85 kg/m.   General: The patient is well-developed and well-nourished and in no acute distress  HEENT:  Head is [place]/AT.  Sclera are anicteric.  Funduscopic exam shows normal optic discs and retinal vessels.  Neck: No carotid bruits are noted.  The neck is nontender.  Cardiovascular: The heart has a regular rate and rhythm with a normal S1 and S2. There were no murmurs, gallops or rubs.    Skin: Extremities are without rash or  edema.  Musculoskeletal:  Back is nontender  Neurologic Exam  Mental status: The patient is alert and oriented x 3 at the time of the examination. The patient has apparent normal recent and remote memory, with an apparently normal attention span and concentration ability.   Speech is normal.  Cranial nerves: Extraocular movements are full.  Pupils are equal, round, and reactive to light and  accomodation.  Color vision was slightly brighter on the right than the left..  Facial symmetry is present. There is good facial sensation to soft touch bilaterally.Facial strength is normal.  Trapezius and sternocleidomastoid strength is normal. No dysarthria is noted.  The tongue is midline, and the patient has symmetric elevation of the soft palate. No obvious hearing deficits are noted.  Motor:  Muscle bulk is normal.   Tone is normal. Strength is  5 / 5 in all 4 extremities.   Sensory: Sensory testing is reduced to touch and temperature on the left vs right.  Coordination: Cerebellar testing reveals good finger-nose-finger and heel-to-shin bilaterally.  Gait and station: Station is normal.   Gait is normal. Tandem gait is mildly wide.. Romberg is negative.   Reflexes: Deep tendon reflexes are symmetric and normal bilaterally.   Plantar responses are flexor.     ASSESSMENT AND PLAN  1. Cerebrovascular accident (CVA), unspecified mechanism (HCC)   2. Ascending aorta dilatation (HCC)   3. Tachycardia   4. White matter abnormality on MRI of brain   5. Other fatigue   6. Urge incontinence   7. Depression with anxiety     1.   Changes on MRI are more consistent with stroke and other vascular change than to demyelination despite her past diagnosis of MS.  I do not think that she has multiple sclerosis.  As part of her evaluation last year, she had a Holter monitor which showed some runs of heart rate above 150.  Additionally, the echocardiogram showed a dilated a sending aorta.  Therefore, I like her to see cardiology to determine if any further evaluation is necessary for the rhythm disorder and to follow-up with the aortic finding 2.     I will switch her from Detrol to [facility] for the bladder dysfunction. 3.    Continue modafinil for fatigue and sleepiness.  I was considering placing her on a stimulant such as phentermine but will hold off until after she has a cardiology evaluation 4.     She will return to see [place] in 6 months or sooner for new or worsening neurologic symptoms.  40-minute evaluation with most of that time face-to-face during the office visit and additional time spent reviewing MRIs, cardiology studies and other records and with documentation.  Coren Sagan A. Epimenio Foot, MD, PhD, FAAN Certified in Neurology, Clinical Neurophysiology, Sleep Medicine, Pain Medicine and Neuroimaging Director, [facility]  [facility] [address], [address] [place], [place] [address] [phone]

## 2019-08-20 NOTE — Telephone Encounter (Signed)
Patient states she will call to make her next appointment since [place].

## 2019-08-26 DIAGNOSIS — Z7189 Other specified counseling: Secondary | ICD-10-CM | POA: Insufficient documentation

## 2019-08-26 NOTE — H&P (View-Only) (Signed)
Cardiology Office Note   Date:  08/27/2019   ID:  Yvonne Page, DOB [birth year], MRN [id number]  PCP:  Asa Lente, MD  Cardiologist:   No primary care provider on file. Referring:  Asa Lente, MD  No chief complaint on file.     History of Present Illness: Yvonne Page is a 59 y.o. female who presents for evaluation of possible CVA.    She lives in [place] state but comes here for part of the year to live with her son.  She has MS.  She was found to have a possible embolic stroke on MRI about 7 years ago.  She had a more recent MRI and this suggested multiple strokes.  Involving the right temporoparietal region, left basal ganglia and left corona radiata.   She has had a work up .  She had a negative carotid Doppler.  Echo with bubble study demonstrated no significant abnormalities.  However, the aorta was mildly enlarged at 38 mm.    She does not report any significant cardiovascular symptoms.  The patient denies any new symptoms such as chest discomfort, neck or arm discomfort. There has been no new shortness of breath, PND or orthopnea. There have been no reported palpitations, presyncope or syncope.      Past Medical History:  Diagnosis Date  . Dyslipidemia   . HTN (hypertension)   . Migraines   . Multiple sclerosis (HCC)     History reviewed. No pertinent surgical history.   Current Outpatient Medications  Medication Sig Dispense Refill  . acetaminophen (TYLENOL) 325 MG tablet Take 650 mg by mouth every 6 (six) hours as needed.    . ASPIRIN 81 PO Take 1 tablet by mouth daily.    Marland Kitchen atorvastatin (LIPITOR) 20 MG tablet Take 20 mg by mouth daily.    . Cholecalciferol (VITAMIN D3 PO) Take 1,000 Units by mouth daily.     . DULoxetine (CYMBALTA) 60 MG capsule TAKE 1 CAPSULE BY MOUTH EVERY DAY 90 capsule 0  . lisinopril (ZESTRIL) 20 MG tablet 1 TABLET IN AM AND 1/2 TAB AT BEDTIME DAILY ORALLY 90    . MAXALT 10 MG tablet Take 1 tablet at the onset of migraine. May repeat  once in 2 hr if needed. No more than 2 tablets in 24 hours. 10 tablet 11  . modafinil (PROVIGIL) 200 MG tablet Take 1 tablet (200 mg total) by mouth 2 (two) times daily. 60 tablet 2  . propranolol (INDERAL) 80 MG tablet Take 80 mg by mouth 2 (two) times daily.    . solifenacin (VESICARE) 10 MG tablet Take 1 tablet (10 mg total) by mouth daily. 90 tablet 3   No current facility-administered medications for this visit.    Allergies:   Gabapentin and Lamotrigine    Social History:  The patient  reports that she has been smoking. She does not have any smokeless tobacco history on file.   Family History:  The patient's family history includes Heart disease in her mother; Hypertension in her father; Parkinson's disease in her father; Evelene Croon Parkinson White syndrome in her father.    ROS:  Please see the history of present illness.   Otherwise, review of systems are positive for none.   All other systems are reviewed and negative.    PHYSICAL EXAM: VS:  BP 102/70   Pulse 61   Ht 5\' 2"  (1.575 m)   Wt 192 lb (87.1 kg)   BMI 35.12 kg/m  ,  BMI Body mass index is 35.12 kg/m. GENERAL:  Well appearing HEENT:  Pupils equal round and reactive, fundi not visualized, oral mucosa unremarkable NECK:  No jugular venous distention, waveform within normal limits, carotid upstroke brisk and symmetric, no bruits, no thyromegaly LYMPHATICS:  No cervical, inguinal adenopathy LUNGS:  Clear to auscultation bilaterally BACK:  No CVA tenderness CHEST:  Unremarkable HEART:  PMI not displaced or sustained,S1 and S2 within normal limits, no S3, no S4, no clicks, no rubs, no murmurs ABD:  Flat, positive bowel sounds normal in frequency in pitch, no bruits, no rebound, no guarding, no midline pulsatile mass, no hepatomegaly, no splenomegaly EXT:  2 plus pulses throughout, no edema, no cyanosis no clubbing SKIN:  No rashes no nodules NEURO:  Cranial nerves II through XII grossly intact, motor grossly intact  throughout PSYCH:  Cognitively intact, oriented to person place and time    EKG:  EKG is ordered today. The ekg ordered today demonstrates sinus rhythm, rate 61, axis within normal limits, intervals within normal limits, no acute ST-T wave changes.   Recent Labs: No results found for requested labs within last 8760 hours.    Lipid Panel No results found for: CHOL, TRIG, HDL, CHOLHDL, VLDL, LDLCALC, LDLDIRECT    Wt Readings from Last 3 Encounters:  08/27/19 192 lb (87.1 kg)  08/20/19 196 lb (88.9 kg)  03/21/19 191 lb 12.8 oz (87 kg)      Other studies Reviewed: Additional studies/ records that were reviewed today include: MRI, neurology records. Review of the above records demonstrates:  Please see elsewhere in the note.     ASSESSMENT AND PLAN:  ASCENDING AORTIC DILATATION:    This is mild and I will follow this with possible repeat imaging in the future.  CVA:  I did discuss this with Dr. Sater (neurology).  Given the probable embolic CVA from the past she needs a TEE and if this is negative she will need a LINQ.   Current medicines are reviewed at length with the patient today.  The patient does not have concerns regarding medicines.  The following changes have been made:  no change  Labs/ tests ordered today include: None  Orders Placed This Encounter  Procedures  . EKG 12-Lead     Disposition:   FU with me after the above studies.     Signed, Zacharia Sowles, MD  08/27/2019 5:14 PM    [facility]    

## 2019-08-26 NOTE — Progress Notes (Signed)
Cardiology Office Note   Date:  08/27/2019   ID:  Yvonne Page, DOB [birth year], MRN [id number]  PCP:  Asa Lente, MD  Cardiologist:   No primary care provider on file. Referring:  Asa Lente, MD  No chief complaint on file.     History of Present Illness: Yvonne Page is a 59 y.o. female who presents for evaluation of possible CVA.    She lives in [place] but comes here for part of the year to live with her son.  She has MS.  She was found to have a possible embolic stroke on MRI about 7 years ago.  She had a more recent MRI and this suggested multiple strokes.  Involving the right temporoparietal region, left basal ganglia and left corona radiata.   She has had a work up .  She had a negative carotid Doppler.  Echo with bubble study demonstrated no significant abnormalities.  However, the aorta was mildly enlarged at 38 mm.    She does not report any significant cardiovascular symptoms.  The patient denies any new symptoms such as chest discomfort, neck or arm discomfort. There has been no new shortness of breath, PND or orthopnea. There have been no reported palpitations, presyncope or syncope.      Past Medical History:  Diagnosis Date  . Dyslipidemia   . HTN (hypertension)   . Migraines   . Multiple sclerosis (HCC)     History reviewed. No pertinent surgical history.   Current Outpatient Medications  Medication Sig Dispense Refill  . acetaminophen (TYLENOL) 325 MG tablet Take 650 mg by mouth every 6 (six) hours as needed.    . ASPIRIN 81 PO Take 1 tablet by mouth daily.    Marland Kitchen atorvastatin (LIPITOR) 20 MG tablet Take 20 mg by mouth daily.    . Cholecalciferol (VITAMIN D3 PO) Take 1,000 Units by mouth daily.     . DULoxetine (CYMBALTA) 60 MG capsule TAKE 1 CAPSULE BY MOUTH EVERY DAY 90 capsule 0  . lisinopril (ZESTRIL) 20 MG tablet 1 TABLET IN AM AND 1/2 TAB AT BEDTIME DAILY ORALLY 90    . MAXALT 10 MG tablet Take 1 tablet at the onset of migraine. May repeat  once in 2 hr if needed. No more than 2 tablets in 24 hours. 10 tablet 11  . modafinil (PROVIGIL) 200 MG tablet Take 1 tablet (200 mg total) by mouth 2 (two) times daily. 60 tablet 2  . propranolol (INDERAL) 80 MG tablet Take 80 mg by mouth 2 (two) times daily.    . solifenacin (VESICARE) 10 MG tablet Take 1 tablet (10 mg total) by mouth daily. 90 tablet 3   No current facility-administered medications for this visit.    Allergies:   Gabapentin and Lamotrigine    Social History:  The patient  reports that she has been smoking. She does not have any smokeless tobacco history on file.   Family History:  The patient's family history includes Heart disease in her mother; Hypertension in her father; Parkinson's disease in her father; Evelene Croon Parkinson White syndrome in her father.    ROS:  Please see the history of present illness.   Otherwise, review of systems are positive for none.   All other systems are reviewed and negative.    PHYSICAL EXAM: VS:  BP 102/70   Pulse 61   Ht 5\' 2"  (1.575 m)   Wt 192 lb (87.1 kg)   BMI 35.12 kg/m  ,  BMI Body mass index is 35.12 kg/m. GENERAL:  Well appearing HEENT:  Pupils equal round and reactive, fundi not visualized, oral mucosa unremarkable NECK:  No jugular venous distention, waveform within normal limits, carotid upstroke brisk and symmetric, no bruits, no thyromegaly LYMPHATICS:  No cervical, inguinal adenopathy LUNGS:  Clear to auscultation bilaterally BACK:  No CVA tenderness CHEST:  Unremarkable HEART:  PMI not displaced or sustained,S1 and S2 within normal limits, no S3, no S4, no clicks, no rubs, no murmurs ABD:  Flat, positive bowel sounds normal in frequency in pitch, no bruits, no rebound, no guarding, no midline pulsatile mass, no hepatomegaly, no splenomegaly EXT:  2 plus pulses throughout, no edema, no cyanosis no clubbing SKIN:  No rashes no nodules NEURO:  Cranial nerves II through XII grossly intact, motor grossly intact  throughout PSYCH:  Cognitively intact, oriented to person place and time    EKG:  EKG is ordered today. The ekg ordered today demonstrates sinus rhythm, rate 61, axis within normal limits, intervals within normal limits, no acute ST-T wave changes.   Recent Labs: No results found for requested labs within last 8760 hours.    Lipid Panel No results found for: CHOL, TRIG, HDL, CHOLHDL, VLDL, LDLCALC, LDLDIRECT    Wt Readings from Last 3 Encounters:  08/27/19 192 lb (87.1 kg)  08/20/19 196 lb (88.9 kg)  03/21/19 191 lb 12.8 oz (87 kg)      Other studies Reviewed: Additional studies/ records that were reviewed today include: MRI, neurology records. Review of the above records demonstrates:  Please see elsewhere in the note.     ASSESSMENT AND PLAN:  ASCENDING AORTIC DILATATION:    This is mild and I will follow this with possible repeat imaging in the future.  CVA:  I did discuss this with Dr. Felecia Shelling (neurology).  Given the probable embolic CVA from the past she needs a TEE and if this is negative she will need a LINQ.   Current medicines are reviewed at length with the patient today.  The patient does not have concerns regarding medicines.  The following changes have been made:  no change  Labs/ tests ordered today include: None  Orders Placed This Encounter  Procedures  . EKG 12-Lead     Disposition:   FU with me after the above studies.     Signed, Minus Breeding, MD  08/27/2019 5:14 PM    [facility]

## 2019-08-27 ENCOUNTER — Encounter: Payer: Self-pay | Admitting: Cardiology

## 2019-08-27 ENCOUNTER — Ambulatory Visit (INDEPENDENT_AMBULATORY_CARE_PROVIDER_SITE_OTHER): Payer: Self-pay | Admitting: Cardiology

## 2019-08-27 ENCOUNTER — Other Ambulatory Visit: Payer: Self-pay

## 2019-08-27 VITALS — BP 102/70 | HR 61 | Ht 62.0 in | Wt 192.0 lb

## 2019-08-27 DIAGNOSIS — I712 Thoracic aortic aneurysm, without rupture: Secondary | ICD-10-CM

## 2019-08-27 DIAGNOSIS — I7121 Aneurysm of the ascending aorta, without rupture: Secondary | ICD-10-CM

## 2019-08-27 DIAGNOSIS — Z7189 Other specified counseling: Secondary | ICD-10-CM

## 2019-08-27 NOTE — Patient Instructions (Signed)
Medication Instructions:  The current medical regimen is effective;  continue present plan and medications.  *If you need a refill on your cardiac medications before your next appointment, please call your pharmacy*  Follow-Up: At [facility], you and your health needs are our priority.  As part of our continuing mission to provide you with exceptional heart care, we have created designated Provider Care Teams.  These Care Teams include your primary Cardiologist (physician) and Advanced Practice Providers (APPs -  Physician Assistants and Nurse Practitioners) who all work together to provide you with the care you need, when you need it.  We recommend signing up for the patient portal called "MyChart".  Sign up information is provided on this After Visit Summary.  MyChart is used to connect with patients for Virtual Visits (Telemedicine).  Patients are able to view lab/test results, encounter notes, upcoming appointments, etc.  Non-urgent messages can be sent to your provider as well.   To learn more about what you can do with MyChart, go to [email or web address].    Your next appointment:   6 month(s)  The format for your next appointment:   In Person  Provider:   James Hochrein, MD   Thank you for choosing [facility]!!     

## 2019-08-29 ENCOUNTER — Telehealth: Payer: Self-pay | Admitting: *Deleted

## 2019-08-29 DIAGNOSIS — I639 Cerebral infarction, unspecified: Secondary | ICD-10-CM

## 2019-08-29 DIAGNOSIS — Z01812 Encounter for preprocedural laboratory examination: Secondary | ICD-10-CM

## 2019-08-29 NOTE — Telephone Encounter (Signed)
Pt aware she needs a TEE and is agreeable to be scheduled for next week.  CPT [phone] Will need COVID screen and CBC/BMP at [facility] prior to.  Pt is aware I will call back with instructions, date and time once everything has been scheduled.  Pt has been scheduled for TEE on Wednesday September 03, 2019 with Dr Duke Salvia at 9 am - will need to arrive at Short Stay at [facility] at 8am.  NPO p midnight,  meds OK with sips of water.  Bring medication list and insurance card, must have someone to drive.  Have lab at [facility] on Monday (CBC/BMP) with Covid screen to follow (in drive-thru).  Must self quarantine until time of the TEE Wednesday.

## 2019-08-29 NOTE — Telephone Encounter (Signed)
Spoke with patient: She will report to [facility] main entrance on Monday for lab work - she will check in then proceed to [facility] to pick up the lab order then go to the lab there.  She will then return to her car and follow the signs for the drive-thru Covid screening. Once she has the Covid screen she will return home to self quarantine until the time of the TEE.   TEE is scheduled for Wednesday 3/24 at [facility] (address given)   Pt aware to check in at main entrance A at water fountain at 8 am.  She is aware NPO after midnight except meds with sips of water.  Aware to have someone with her to drive her home. She will c/b if any questions or concerns prior to then.

## 2019-08-29 NOTE — Telephone Encounter (Signed)
Rollene Rotunda, MD  Sharin Grave, RN  Yes       Previous Messages   ----- Message -----  From: Sharin Grave, RN  Sent: 08/29/2019  9:44 AM EDT  To: Rollene Rotunda, MD   Is the DX: CVA?  ----- Message -----  From: Rollene Rotunda, MD  Sent: 08/29/2019  9:19 AM EDT  To: Sharin Grave, RN   Hi, This patient does need a tee. I warned her that her neurologist might want that. Can you please call and arrange. Thanks.

## 2019-08-31 ENCOUNTER — Other Ambulatory Visit: Payer: Self-pay | Admitting: Neurology

## 2019-09-01 ENCOUNTER — Other Ambulatory Visit: Payer: Self-pay

## 2019-09-01 ENCOUNTER — Other Ambulatory Visit (HOSPITAL_COMMUNITY)
Admission: RE | Admit: 2019-09-01 | Discharge: 2019-09-01 | Disposition: A | Discharge status: Home / Self Care | Payer: Self-pay | Source: Ambulatory Visit | Attending: Cardiology | Admitting: Cardiology

## 2019-09-01 ENCOUNTER — Other Ambulatory Visit (HOSPITAL_COMMUNITY)
Admission: RE | Admit: 2019-09-01 | Discharge: 2019-09-01 | Disposition: A | Discharge status: Home / Self Care | Payer: HRSA Program | Source: Ambulatory Visit | Attending: Cardiovascular Disease | Admitting: Cardiovascular Disease

## 2019-09-01 DIAGNOSIS — Z20822 Contact with and (suspected) exposure to covid-19: Secondary | ICD-10-CM | POA: Insufficient documentation

## 2019-09-01 DIAGNOSIS — I639 Cerebral infarction, unspecified: Secondary | ICD-10-CM | POA: Insufficient documentation

## 2019-09-01 DIAGNOSIS — Z01812 Encounter for preprocedural laboratory examination: Secondary | ICD-10-CM | POA: Diagnosis present

## 2019-09-01 LAB — BASIC METABOLIC PANEL
Anion gap: 7 (ref 5–15)
BUN: 12 mg/dL (ref 6–20)
CO2: 26 mmol/L (ref 22–32)
Calcium: 8.6 mg/dL — ABNORMAL LOW (ref 8.9–10.3)
Chloride: 107 mmol/L (ref 98–111)
Creatinine, Ser: 0.74 mg/dL (ref 0.44–1.00)
GFR calc Af Amer: >60 mL/min (ref >60)
GFR calc non Af Amer: >60 mL/min (ref >60)
Glucose, Bld: 100 mg/dL — ABNORMAL HIGH (ref 70–99)
Potassium: 4.2 mmol/L (ref 3.5–5.1)
Sodium: 140 mmol/L (ref 135–145)

## 2019-09-01 LAB — CBC
HCT: 44.2 % (ref 36.0–46.0)
Hemoglobin: 14.5 g/dL (ref 12.0–15.0)
MCH: 33.6 pg (ref 26.0–34.0)
MCHC: 32.8 g/dL (ref 30.0–36.0)
MCV: 102.3 fL — ABNORMAL HIGH (ref 80.0–100.0)
Platelets: 303 10*3/uL (ref 150–400)
RBC: 4.32 MIL/uL (ref 3.87–5.11)
RDW: 12.4 % (ref 11.5–15.5)
WBC: 8.3 10*3/uL (ref 4.0–10.5)
nRBC: 0 % (ref 0.0–0.2)

## 2019-09-01 LAB — SARS CORONAVIRUS 2 (TAT 6-24 HRS): SARS Coronavirus 2: NEGATIVE

## 2019-09-03 ENCOUNTER — Ambulatory Visit (HOSPITAL_BASED_OUTPATIENT_CLINIC_OR_DEPARTMENT_OTHER)
Admission: RE | Admit: 2019-09-03 | Discharge: 2019-09-03 | Disposition: A | Discharge status: Home / Self Care | Payer: Self-pay | Source: Home / Self Care | Attending: Cardiovascular Disease | Admitting: Cardiovascular Disease

## 2019-09-03 ENCOUNTER — Ambulatory Visit (HOSPITAL_COMMUNITY): Payer: Self-pay | Admitting: Anesthesiology

## 2019-09-03 ENCOUNTER — Encounter (HOSPITAL_COMMUNITY): Payer: Self-pay | Admitting: Cardiovascular Disease

## 2019-09-03 ENCOUNTER — Other Ambulatory Visit: Payer: Self-pay

## 2019-09-03 ENCOUNTER — Encounter (HOSPITAL_COMMUNITY)
Admission: RE | Disposition: A | Discharge status: Home / Self Care | Payer: Self-pay | Source: Home / Self Care | Attending: Cardiovascular Disease

## 2019-09-03 ENCOUNTER — Ambulatory Visit (HOSPITAL_COMMUNITY)
Admission: RE | Admit: 2019-09-03 | Discharge: 2019-09-03 | Disposition: A | Discharge status: Home / Self Care | Payer: Self-pay | Attending: Cardiovascular Disease | Admitting: Cardiovascular Disease

## 2019-09-03 ENCOUNTER — Other Ambulatory Visit: Payer: Self-pay | Admitting: Cardiovascular Disease

## 2019-09-03 DIAGNOSIS — Z888 Allergy status to other drugs, medicaments and biological substances status: Secondary | ICD-10-CM | POA: Insufficient documentation

## 2019-09-03 DIAGNOSIS — I63429 Cerebral infarction due to embolism of unspecified anterior cerebral artery: Secondary | ICD-10-CM

## 2019-09-03 DIAGNOSIS — F17200 Nicotine dependence, unspecified, uncomplicated: Secondary | ICD-10-CM | POA: Insufficient documentation

## 2019-09-03 DIAGNOSIS — I639 Cerebral infarction, unspecified: Secondary | ICD-10-CM

## 2019-09-03 DIAGNOSIS — E785 Hyperlipidemia, unspecified: Secondary | ICD-10-CM | POA: Insufficient documentation

## 2019-09-03 DIAGNOSIS — I10 Essential (primary) hypertension: Secondary | ICD-10-CM

## 2019-09-03 DIAGNOSIS — Z7982 Long term (current) use of aspirin: Secondary | ICD-10-CM | POA: Insufficient documentation

## 2019-09-03 DIAGNOSIS — Z79899 Other long term (current) drug therapy: Secondary | ICD-10-CM | POA: Insufficient documentation

## 2019-09-03 DIAGNOSIS — G35 Multiple sclerosis: Secondary | ICD-10-CM | POA: Insufficient documentation

## 2019-09-03 HISTORY — PX: BUBBLE STUDY: SHX6837

## 2019-09-03 HISTORY — PX: TEE WITHOUT CARDIOVERSION: SHX5443

## 2019-09-03 SURGERY — ECHOCARDIOGRAM, TRANSESOPHAGEAL
Anesthesia: Monitor Anesthesia Care

## 2019-09-03 MED ORDER — PHENYLEPHRINE 40 MCG/ML (10ML) SYRINGE FOR IV PUSH (FOR BLOOD PRESSURE SUPPORT)
PREFILLED_SYRINGE | INTRAVENOUS | Status: DC | PRN
  Administered 2019-09-03 (×2): 80 ug via INTRAVENOUS

## 2019-09-03 MED ORDER — PROMETHAZINE HCL 25 MG/ML IJ SOLN: 6.2500 mg | INTRAMUSCULAR | Status: DC | PRN

## 2019-09-03 MED ORDER — SODIUM CHLORIDE 0.9 % IV SOLN: INTRAVENOUS | Status: DC | PRN

## 2019-09-03 MED ORDER — PROPOFOL 500 MG/50ML IV EMUL
INTRAVENOUS | Status: DC | PRN
  Administered 2019-09-03: 100 ug/kg/min via INTRAVENOUS

## 2019-09-03 NOTE — Discharge Instructions (Signed)

## 2019-09-03 NOTE — Anesthesia Preprocedure Evaluation (Addendum)
Anesthesia Evaluation  Patient identified by MRN, date of birth, ID band Patient awake    Reviewed: Allergy & Precautions, NPO status , Patient's Chart, lab work & pertinent test results, reviewed documented beta blocker date and time   History of Anesthesia Complications Negative for: history of anesthetic complications  Airway Mallampati: II  TM Distance: >3 FB Neck ROM: Full    Dental  (+) Chipped,    Pulmonary Current Smoker,    Pulmonary exam normal        Cardiovascular hypertension, Pt. on medications and Pt. on home beta blockers Normal cardiovascular exam  TTE 01/2019: EF 60-65%, mild dilatation of the ascending aorta measuring 38 mm    Neuro/Psych  Headaches, Anxiety Depression Multiple sclerosis CVA    GI/Hepatic negative GI ROS, Neg liver ROS,   Endo/Other  negative endocrine ROS  Renal/GU negative Renal ROS  negative genitourinary   Musculoskeletal negative musculoskeletal ROS (+)   Abdominal   Peds  Hematology negative hematology ROS (+)   Anesthesia Other Findings Day of surgery medications reviewed with patient.  Reproductive/Obstetrics negative OB ROS                            Anesthesia Physical Anesthesia Plan  ASA: III  Anesthesia Plan: MAC   Post-op Pain Management:    Induction:   PONV Risk Score and Plan: Treatment may vary due to age or medical condition and Propofol infusion  Airway Management Planned: Natural Airway and Nasal Cannula  Additional Equipment: None  Intra-op Plan:   Post-operative Plan:   Informed Consent: I have reviewed the patients History and Physical, chart, labs and discussed the procedure including the risks, benefits and alternatives for the proposed anesthesia with the patient or authorized representative who has indicated his/her understanding and acceptance.       Plan Discussed with: CRNA  Anesthesia Plan Comments:         Anesthesia Quick Evaluation

## 2019-09-03 NOTE — Interval H&P Note (Signed)
History and Physical Interval Note:  09/03/2019 8:40 AM  Yvonne Page  has presented today for surgery, with the diagnosis of STROKE.  The various methods of treatment have been discussed with the patient and family. After consideration of risks, benefits and other options for treatment, the patient has consented to  Procedure(s): TRANSESOPHAGEAL ECHOCARDIOGRAM (TEE) (N/A) as a surgical intervention.  The patient's history has been reviewed, patient examined, no change in status, stable for surgery.  I have reviewed the patient's chart and labs.  Questions were answered to the patient's satisfaction.     Chilton Si,, MD

## 2019-09-03 NOTE — Anesthesia Procedure Notes (Signed)
Procedure Name: MAC Date/Time: 09/03/2019 9:20 AM Performed by: Kyung Rudd, CRNA Pre-anesthesia Checklist: Patient identified, Emergency Drugs available, Suction available and Patient being monitored Patient Re-evaluated:Patient Re-evaluated prior to induction Oxygen Delivery Method: Nasal cannula Preoxygenation: Pre-oxygenation with 100% oxygen Induction Type: IV induction Placement Confirmation: positive ETCO2 Dental Injury: Teeth and Oropharynx as per pre-operative assessment

## 2019-09-03 NOTE — Progress Notes (Signed)
  Echocardiogram TEE has been performed.  Yvonne Page 09/03/2019, 10:14 AM

## 2019-09-03 NOTE — Transfer of Care (Signed)
Immediate Anesthesia Transfer of Care Note  Patient: Yvonne Page  Procedure(s) Performed: TRANSESOPHAGEAL ECHOCARDIOGRAM (TEE) (N/A ) BUBBLE STUDY  Patient Location: PACU  Anesthesia Type:MAC  Level of Consciousness: awake, alert  and oriented  Airway & Oxygen Therapy: Patient Spontanous Breathing  Post-op Assessment: Report given to RN, Post -op Vital signs reviewed and stable and Patient moving all extremities X 4  Post vital signs: Reviewed and stable  Last Vitals:  Vitals Value Taken Time  BP    Temp    Pulse    Resp    SpO2      Last Pain:  Vitals:   09/03/19 0808  TempSrc: Temporal  PainSc: 0-No pain         Complications: No apparent anesthesia complications

## 2019-09-03 NOTE — Anesthesia Postprocedure Evaluation (Signed)
Anesthesia Post Note  Patient: Yvonne Page  Procedure(s) Performed: TRANSESOPHAGEAL ECHOCARDIOGRAM (TEE) (N/A ) BUBBLE STUDY     Patient location during evaluation: PACU Anesthesia Type: MAC Level of consciousness: awake and alert and oriented Pain management: pain level controlled Vital Signs Assessment: post-procedure vital signs reviewed and stable Respiratory status: spontaneous breathing, nonlabored ventilation and respiratory function stable Cardiovascular status: blood pressure returned to baseline Postop Assessment: no apparent nausea or vomiting Anesthetic complications: no    Last Vitals:  Vitals:   09/03/19 0808 09/03/19 0950  BP: 132/70 (!) 83/36  Pulse: (!) 57   Resp: 11 19  Temp: (!) 36.2 C 36.6 C  SpO2: 98% 94%    Last Pain:  Vitals:   09/03/19 0950  TempSrc: Oral  PainSc: 0-No pain                 Brennan Bailey

## 2019-09-03 NOTE — CV Procedure (Signed)
Brief TEE Note  LVEF 60-65% Trivial MR and AR Mild TR No ASD or PFO by color flow Doppler or saline microcavitation study No LAA thrombus or mass  Procedure completed with no complications. For additional details see full report.  Rechelle Niebla C. Duke Salvia, MD, [facility] 09/03/2019 9:42 AM

## 2019-09-25 ENCOUNTER — Other Ambulatory Visit: Payer: Self-pay | Admitting: Neurology

## 2019-10-03 ENCOUNTER — Ambulatory Visit (INDEPENDENT_AMBULATORY_CARE_PROVIDER_SITE_OTHER): Payer: Medicare Other | Admitting: Thoracic Surgery (Cardiothoracic Vascular Surgery)

## 2019-10-03 ENCOUNTER — Encounter: Payer: Self-pay | Admitting: Thoracic Surgery (Cardiothoracic Vascular Surgery)

## 2019-10-03 ENCOUNTER — Ambulatory Visit
Admission: RE | Admit: 2019-10-03 | Discharge: 2019-10-03 | Disposition: A | Discharge status: Home / Self Care | Payer: Medicare Other | Source: Ambulatory Visit | Attending: Thoracic Surgery (Cardiothoracic Vascular Surgery) | Admitting: Thoracic Surgery (Cardiothoracic Vascular Surgery)

## 2019-10-03 ENCOUNTER — Other Ambulatory Visit: Payer: Self-pay

## 2019-10-03 VITALS — BP 120/73 | HR 67 | Temp 97.5°F | Resp 16 | Ht 62.0 in | Wt 195.0 lb

## 2019-10-03 DIAGNOSIS — I712 Thoracic aortic aneurysm, without rupture, unspecified: Secondary | ICD-10-CM

## 2019-10-03 DIAGNOSIS — K449 Diaphragmatic hernia without obstruction or gangrene: Secondary | ICD-10-CM | POA: Diagnosis not present

## 2019-10-03 DIAGNOSIS — E278 Other specified disorders of adrenal gland: Secondary | ICD-10-CM | POA: Diagnosis not present

## 2019-10-03 MED ORDER — IOHEXOL 350 MG/ML SOLN
75.0000 mL | Freq: Once | INTRAVENOUS | Status: AC | PRN
  Administered 2019-10-03: 75 mL via INTRAVENOUS

## 2019-10-03 NOTE — Progress Notes (Signed)
[address]       [place] [address]             [phone]                    [name] [facility] Medical Record #[id number] Date of Birth: Jun 04, 1961  Referring: Asa Lente, MD Primary Care: Asa Lente, MD Primary Cardiologist: Rollene Rotunda, MD  Chief Complaint:    Chief Complaint  Patient presents with  . Thoracic Aortic Aneurysm    6 month f/u with CTA today    History of Present Illness:    Yvonne Page 59 y.o. female presents for follow-up to review CT chest.  She also has a history of multiple sclerosis and her symptoms continue to wax and wane.  In regards to her aortic aneurysm that measures 4 cm on today's scan.  The concerning thing is that there is an incidental finding of a 3.1 cm left adrenal mass and a small right diaphragmatic hernia.  She denies any respiratory symptoms and only admits to chronic fatigue.  She states that she is planning on returning back to [place] beginning of May to return back to [place] on Thanksgiving.    Past Medical History:  Diagnosis Date  . Dyslipidemia   . HTN (hypertension)   . Migraines   . Multiple sclerosis (HCC)     Past Surgical History:  Procedure Laterality Date  . BUBBLE STUDY  09/03/2019   Procedure: BUBBLE STUDY;  Surgeon: Chilton Si, MD;  Location: [facility] ENDOSCOPY;  Service: Cardiovascular;;  . TEE WITHOUT CARDIOVERSION N/A 09/03/2019   Procedure: TRANSESOPHAGEAL ECHOCARDIOGRAM (TEE);  Surgeon: Chilton Si, MD;  Location: [facility] ENDOSCOPY;  Service: Cardiovascular;  Laterality: N/A;    Family History  Problem Relation Age of Onset  . Heart disease Mother   . Parkinson's disease Father   . Hypertension Father   . Evelene Croon Parkinson White syndrome Father      Social History   Tobacco Use  Smoking Status Current Every Day Smoker  Smokeless Tobacco Never Used    Social History   Substance and Sexual Activity  Alcohol Use None     Allergies  Allergen  Reactions  . Gabapentin     Severe leg cramps  . Lamotrigine Rash    Chest pain    Current Outpatient Medications  Medication Sig Dispense Refill  . acetaminophen (TYLENOL) 500 MG tablet Take 500 mg by mouth every 6 (six) hours as needed for moderate pain.    Marland Kitchen aspirin EC 81 MG tablet Take 81 mg by mouth daily.    Marland Kitchen atorvastatin (LIPITOR) 20 MG tablet Take 20 mg by mouth daily.    . Biotin 03009 MCG TABS Take 10,000 mcg by mouth daily.    . Carboxymethylcellul-Glycerin (LUBRICATING EYE DROPS OP) Place 1 drop into both eyes daily as needed (dry eyes).    . Cholecalciferol (DIALYVITE VITAMIN D 5000) 125 MCG (5000 UT) capsule Take 10,000 Units by mouth daily.    . DULoxetine (CYMBALTA) 60 MG capsule TAKE 1 CAPSULE BY MOUTH EVERY DAY (Patient taking differently: Take 60 mg by mouth daily. ) 90 capsule 3  . lisinopril (ZESTRIL) 20 MG tablet Take 10-20 mg by mouth See admin instructions. Take 20 mg in the morning and 10 mg at night    . MAXALT 10 MG tablet Take 1 tablet at the onset of migraine. May repeat once in 2 hr if needed.  No more than 2 tablets in 24 hours. (Patient taking differently: Take 10 mg by mouth as needed for migraine. Take 1 tablet at the onset of migraine. May repeat once in 2 hr if needed. No more than 2 tablets in 24 hours.) 10 tablet 11  . modafinil (PROVIGIL) 200 MG tablet TAKE 1 TABLET BY MOUTH TWICE A DAY 60 tablet 0  . propranolol (INDERAL) 80 MG tablet Take 80 mg by mouth 2 (two) times daily.    . solifenacin (VESICARE) 10 MG tablet Take 1 tablet (10 mg total) by mouth daily. 90 tablet 3   No current facility-administered medications for this visit.    Review of Systems  Constitutional: Positive for malaise/fatigue.  Respiratory: Negative.   Cardiovascular: Negative.     PHYSICAL EXAMINATION: BP 120/73 (BP Location: Right Arm, Patient Position: Sitting, Cuff Size: Normal)   Pulse 67   Temp (!) 97.5 F (36.4 C) (Temporal)   Resp 16   Ht 5\' 2"  (1.575 m)   Wt  195 lb (88.5 kg)   SpO2 97% Comment: RA  BMI 35.67 kg/m   Physical Exam  Constitutional: She is oriented to person, place, and time and well-developed, well-nourished, and in no distress. No distress.  HENT:  Head: Normocephalic and atraumatic.  Eyes: Conjunctivae are normal. No scleral icterus.  Neck: No thyromegaly present.  Cardiovascular: Normal rate and regular rhythm.  Pulmonary/Chest: Effort normal. No respiratory distress.  Abdominal: She exhibits no distension.  Musculoskeletal:     Cervical back: Normal range of motion.  Neurological: She is alert and oriented to person, place, and time.  Skin: Skin is warm and dry. She is not diaphoretic.     Diagnostic Studies & Laboratory data:     Recent Radiology Findings:   CT ANGIO CHEST AORTA W/CM & OR WO/CM  Result Date: 10/03/2019 CLINICAL DATA:  Follow-up for thoracic aortic aneurysm. EXAM: CT ANGIOGRAPHY CHEST WITH CONTRAST TECHNIQUE: Multidetector CT imaging of the chest was performed using the standard protocol during bolus administration of intravenous contrast. Multiplanar CT image reconstructions and MIPs were obtained to evaluate the vascular anatomy. CONTRAST:  40mL OMNIPAQUE IOHEXOL 350 MG/ML SOLN COMPARISON:  None. FINDINGS: Cardiovascular: Ascending thoracic aorta is dilated to 4 cm. Aorta tapers to a normal caliber across the arch and throughout the descending portion. No aortic dissection. Mild aortic atherosclerosis. Aortic arch branch vessels are widely patent. Heart is normal in size and configuration. No pericardial effusion. No coronary artery calcifications. Mediastinum/Nodes: No enlarged mediastinal, hilar, or axillary lymph nodes. Thyroid gland, trachea, and esophagus demonstrate no significant findings. Lungs/Pleura: Mild paraseptal emphysema. 3-4 mm perifissural nodule, image 68, series 7. Minimal areas of lung base atelectasis. No evidence of pneumonia or pulmonary edema. No pleural effusion or pneumothorax.  Right-sided Bochdalek's hernia. Upper Abdomen: 3.1 cm left adrenal mass, Hounsfield units averaging 44, nonspecific. No acute findings in the upper abdomen. Musculoskeletal: No fracture or acute finding. There is sclerosis along the mid to upper thoracic spine associated with marked loss of disc height and endplate osteophytes, all felt to be degenerative in origin. No osteolytic lesions. Review of the MIP images confirms the above findings. IMPRESSION: 1. Ascending thoracic aorta measures 4 cm in diameter. No aortic dissection. Mild aortic atherosclerosis. Recommend annual imaging followup by CTA or MRA. This recommendation follows 2010 ACCF/AHA/AATS/ACR/ASA/SCA/SCAI/SIR/STS/SVM Guidelines for the Diagnosis and Management of Patients with Thoracic Aortic Disease. Circulation. 2010; 121: G401-U272. Aortic aneurysm NOS (ICD10-I71.9) 2. No acute findings. 3. Mild paraseptal emphysema. 4. 3-4  mm nonspecific right upper lobe perifissural nodule. No follow-up needed if patient is low-risk. Non-contrast chest CT can be considered in 12 months if patient is high-risk. This recommendation follows the consensus statement: Guidelines for Management of Incidental Pulmonary Nodules Detected on CT Images: From the Fleischner Society 2017; Radiology 2017; 284:228-243. 5. 3.1 cm left adrenal mass. Recommend either follow-up adrenal CT, or adrenal MRI. Aortic aneurysm NOS (ICD10-I71.9). Electronically Signed   By: Amie Portland M.D.   On: 10/03/2019 12:02       I have independently reviewed the above radiology studies  and reviewed the findings with the patient.   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  There has been slight growth but 4 cm this does not warrant surgical repair.  Incidental finding of the left adrenal mass is concerning.  I have spoken with Dr. Bedelia Person for further work-up.  In regards to the diaphragmatic hernia is on the right side covered by the liver, and she denies any symptoms on that side.  This can be observed.  She will follow-up with me for another surveillance CT scan year.      Corliss Skains 10/03/2019 3:21 PM

## 2019-10-07 ENCOUNTER — Telehealth: Payer: Self-pay | Admitting: *Deleted

## 2019-10-07 NOTE — Telephone Encounter (Signed)
Initiated PA maxalt for brand name on CMM. Key: [id number]. In process of completing.

## 2019-10-07 NOTE — Telephone Encounter (Signed)
Submitted PA. Waiting on determination from [employer] Part D.

## 2019-10-08 MED ORDER — MAXALT-MLT 10 MG PO TBDP: 10.0000 mg | ORAL_TABLET | ORAL | 11 refills | Status: DC | PRN

## 2019-10-08 NOTE — Telephone Encounter (Addendum)
Received fax from [employer] that PA Maxalt denied, she must try/fail one of the following first: sumatriptan or naratriptan. She could [facility] coupon at [facility] to get Maxalt for about 22.75 for 10 tablets.  I called pt to discuss. Relayed above information. She would like ODT instead. She would like this called into [facility] and she will use goodrx coupon there instead to get it for about 13-14 dollars per month.  [facility] and cx rx maxalt on file there. LVM.  Her recent CT scan found some abnormalities and she is seeing Dr. Bedelia Person (endocronologist) at 10am tomorrow. They are located at [facility] in [place].   She also had insurance updates and wanted to speak with billing. I transferred her to [facility].

## 2019-10-08 NOTE — Addendum Note (Signed)
Addended by: Arther Abbott on: 10/08/2019 11:42 AM   Modules accepted: Orders

## 2019-10-10 ENCOUNTER — Other Ambulatory Visit: Payer: Self-pay | Admitting: Surgery

## 2019-10-10 DIAGNOSIS — E278 Other specified disorders of adrenal gland: Secondary | ICD-10-CM

## 2019-10-15 ENCOUNTER — Ambulatory Visit
Admission: RE | Admit: 2019-10-15 | Discharge: 2019-10-15 | Disposition: A | Discharge status: Home / Self Care | Payer: Medicare Other | Source: Ambulatory Visit | Attending: Surgery | Admitting: Surgery

## 2019-10-15 DIAGNOSIS — E278 Other specified disorders of adrenal gland: Secondary | ICD-10-CM

## 2019-10-16 ENCOUNTER — Other Ambulatory Visit: Payer: Self-pay | Admitting: Surgery

## 2019-10-16 DIAGNOSIS — Z1231 Encounter for screening mammogram for malignant neoplasm of breast: Secondary | ICD-10-CM

## 2019-10-17 ENCOUNTER — Telehealth: Payer: Self-pay | Admitting: Neurology

## 2019-10-17 NOTE — Telephone Encounter (Signed)
Phone rep checked office voicemail's,at 10:51 [id number] called to report a PA is needed for pt's modafinil (PROVIGIL) 200 MG tablet

## 2019-10-20 NOTE — Telephone Encounter (Signed)
Submitted PA. Waiting on determination from [employer] Part D.  

## 2019-10-20 NOTE — Telephone Encounter (Signed)
Initiated PA on CMM. Key: [id number]. In process of completing.

## 2019-10-20 NOTE — Telephone Encounter (Signed)
Called pharmacy to get more info. They were unsure why pt filled #10 on 10/18/19 using discount card. She paid about 94 dollars. They tried running claim again and it was rejecting stating med not covered but not requesting PA. They suggested we attempt PA to see if we can get coverage. ID: [id number]. RxBIN: [id number], grp: [id number], PCN: [id number]

## 2019-10-20 NOTE — Telephone Encounter (Signed)
Request Reference Number: [id number]. MODAFINIL TAB 200MG  is approved through 04/21/2020. Your patient may now fill this prescription and it will be covered.

## 2019-10-21 ENCOUNTER — Telehealth: Payer: Self-pay | Admitting: Neurology

## 2019-10-21 NOTE — Telephone Encounter (Signed)
Called pt back. Advised I completed PA yesterday on modafinil. PA approved, she can pick up from pharmacy now. She verbalized understanding and appreciation.  She also wanted me to forward to Dr. Epimenio Foot as Lorain Childes that she ended up not seeing cardiologist, was sent to heart surgeon they ordered CT heart and found incidental finding of adrenal mass. Sent her to endocrinologist for further evaluation. They ordered CT abdomen. Found mass to be benign. No surgery required at this time. They will have her f/u in a year and repeat CT scan then to continue to monitor her.

## 2019-10-21 NOTE — Telephone Encounter (Signed)
Pt called wanting to speak to the RN about her medicatios and PA that are needing to be obtained. Please advise.

## 2019-12-19 ENCOUNTER — Other Ambulatory Visit: Payer: Self-pay | Admitting: Neurology

## 2019-12-28 ENCOUNTER — Other Ambulatory Visit: Payer: Self-pay | Admitting: Neurology

## 2020-01-05 ENCOUNTER — Telehealth: Payer: Self-pay | Admitting: Neurology

## 2020-01-05 ENCOUNTER — Other Ambulatory Visit: Payer: Self-pay | Admitting: Neurology

## 2020-01-05 MED ORDER — MODAFINIL 200 MG PO TABS: 200.0000 mg | ORAL_TABLET | Freq: Two times a day (BID) | ORAL | 3 refills | Status: DC

## 2020-01-05 NOTE — Telephone Encounter (Signed)
Spoke with Dr. Epimenio Foot. He will send in prescription now for pt.

## 2020-01-05 NOTE — Telephone Encounter (Signed)
Pt called pharmacy still do not have prescription refill for modafinil (PROVIGIL) 200 MG tablet at [id number]. Pt is out of  modafinil (PROVIGIL) 200 MG tablet

## 2020-01-05 NOTE — Telephone Encounter (Signed)
Noted, thank you

## 2020-01-05 NOTE — Telephone Encounter (Signed)
Checked drug registry. She last refilled rx modafinil on 10/21/19 #50 and 10/18/19 #10. Last seen 08/20/19. Has no follow up scheduled at this time.

## 2020-01-05 NOTE — Telephone Encounter (Signed)
Patient called back explained Yvonne Page's Message below patient understood and and said Thank you Kara Mead . Scheduled apt for November . Thanks Annabelle Harman .

## 2020-01-05 NOTE — Telephone Encounter (Addendum)
[facility] [place] by MD in error. I called and LVM for them to cx rx. Advised we are going to send to a different pharmacy.   Tried calling pt to let her know Dr. Epimenio Foot is okay for her to schedule a f/u in November and called in refills for modafinil for her until then. I got VM, VM full and could not leave message. If she calls, please make appt in November for her to see Dr. Epimenio Foot.

## 2020-01-05 NOTE — Telephone Encounter (Signed)
Pt called stating that she is needing a refill on her modafinil (PROVIGIL) 200 MG tablet but she is not able to come in for an appt. Pt states that she has made provider aware that she will not be in the state till Thanksgiving. [facility] [place], [place] [facility] [address] AT [address] OF [facility] Please advise.

## 2020-01-05 NOTE — Addendum Note (Signed)
Addended byYetta Barre, Kara Mead L on: 01/05/2020 12:12 PM   Modules accepted: Orders

## 2020-01-28 ENCOUNTER — Telehealth: Payer: Self-pay | Admitting: *Deleted

## 2020-01-28 ENCOUNTER — Telehealth: Payer: Self-pay | Admitting: Neurology

## 2020-01-28 NOTE — Telephone Encounter (Signed)
Can you let the pt know, thank you!

## 2020-01-28 NOTE — Telephone Encounter (Signed)
Pt records faxed today to D. Szymanski  [phone].

## 2020-01-28 NOTE — Telephone Encounter (Signed)
Pt called stating that a Ms D. Tonie Griffith has called requesting records for the pt's 7 yr review with disability and they have yet to receive the records. Pt is needing these records sent asap to this Disability Analyst so that they can get the review done. Ms D. Szymanski has sent in the request with all of the information that is needing to send out these records. Ms. Desmond Dike phone number is [phone] Please advise.

## 2020-01-28 NOTE — Telephone Encounter (Signed)
Yvonne Page, can you please help with this? Thank you 

## 2020-03-22 ENCOUNTER — Telehealth: Payer: Self-pay | Admitting: *Deleted

## 2020-03-22 NOTE — Telephone Encounter (Signed)
Request Reference Number: [id number]. MODAFINIL TAB 200MG  is approved through 09/20/2020. Your patient may now fill this prescription and it will be covered.

## 2020-03-22 NOTE — Telephone Encounter (Signed)
Received fax from [facility] that current PA on file for modafinil will expire soon.  Submitted PA modafinil on CMM. Key: [id number]. Waiting on determination from [facility] Medicare Part D .

## 2020-05-10 ENCOUNTER — Ambulatory Visit: Payer: Medicare Other | Admitting: Neurology

## 2020-05-10 ENCOUNTER — Encounter: Payer: Self-pay | Admitting: Neurology

## 2020-05-10 VITALS — BP 117/74 | HR 59 | Ht 62.0 in | Wt 183.0 lb

## 2020-05-10 DIAGNOSIS — I10 Essential (primary) hypertension: Secondary | ICD-10-CM | POA: Diagnosis not present

## 2020-05-10 DIAGNOSIS — M7061 Trochanteric bursitis, right hip: Secondary | ICD-10-CM

## 2020-05-10 DIAGNOSIS — I639 Cerebral infarction, unspecified: Secondary | ICD-10-CM | POA: Diagnosis not present

## 2020-05-10 DIAGNOSIS — R5383 Other fatigue: Secondary | ICD-10-CM

## 2020-05-10 DIAGNOSIS — I7789 Other specified disorders of arteries and arterioles: Secondary | ICD-10-CM | POA: Insufficient documentation

## 2020-05-10 DIAGNOSIS — R9082 White matter disease, unspecified: Secondary | ICD-10-CM | POA: Diagnosis not present

## 2020-05-10 MED ORDER — MAXALT-MLT 10 MG PO TBDP: 10.0000 mg | ORAL_TABLET | ORAL | 11 refills | Status: DC | PRN

## 2020-05-10 NOTE — Progress Notes (Signed)
[facility]  PATIENT: Yvonne Page DOB: [birth year]  REFERRING DOCTOR OR PCP:  Erlene Senters (fax: [phone]) SOURCE: patient, notes from Dr. Arline Asp, will review MRI if available  _________________________________   HISTORICAL  CHIEF COMPLAINT:  Chief Complaint  Patient presents with  . Follow-up    RM 12, alone. Last seen 08/20/19. Has hx CVA. Needs refill on modafinil, cymbalta, vesicare 20mg     HISTORY OF PRESENT ILLNESS:  Yvonne Page is a 59 year old woman who had been diagnosed with MS in the past but has evidence of strokes on MRI.  Update 05/10/2020: She has had no new neurologic symptoms.  We discussed that MRI changes are more c/w strokes than MS.   She is on aspirin 81 mg.  TEE 09/03/2019 showed normal EF%, no source of cardioemboli,no interratrial shunt.     Holter 04/06/2020 showed some short runs of tachycardia.   She has a small thoracic aortic aneurysm  (3.8 cm).  CTA showed mild distal aorta dilatation (3.0 x 2.7).   She also had a benign appearing adrenal mass, likely adenoma.    She is noting right hip pain.   This is worse with walking.  Aleve had not helped much.   She has migraine headaches.  They were helped by Maxalt 10 mg.  Unfortunately, the generic did not help as much as the brand name.     The Vesicare helped her bladder function  The echocardiogram with bubble contrast 01/21/2019 and carotid Dopplers were essentially normal as far as the heart but the aorta was enlarged.      Vascular risks:  Hypertension.  Smokes 1/2 ppd, Hyperlipidemia.   She does not recall having an Echo or carotid doppler.  She takes an 81 mg aspirin daily  Imaging:  MRI of the brain and cervical spine performed 11/11/2014 were personally reviewed.  The MRI of the brain shows a left temporal parietal stroke involving gray and white matter.  This could be embolic in origin.  Additionally, there is a second stroke adjacent to the caudate the corona radiata on  the left.  There are a few T2/flair hyperintense foci, one is juxtacortical on the left and others are subcortical deep white matter or pericallosal.  There are no foci in the posterior fossa or the spinal cord on the cervical spine MRI.  However, the cervical spine MRI does show mild spinal stenosis at C5-C6 associated with moderately severe foraminal narrowing.  MRI of the brain and cervical spine from 02/08/2019 show Multiple strokes involving the right temporoparietal region, left basal ganglia and adjacent white matter and left corona radiata.  Additionally, hemosiderin is noted within the left caudate head stroke.  These are unchanged compared to the 2016 MRI.  These do not appear to be demyelinating. .   Additionally, there are some scattered T2/flair hyperintense foci in the deep white matter and left thalamus most consistent with mild chronic microvascular ischemic change.  Demyelination would be less likely though cannot be completely excluded..   There are no acute findings.  REVIEW OF SYSTEMS: Constitutional: No fevers, chills, sweats, or change in appetite.  She has fatigue Eyes: No visual changes, double vision, eye pain Ear, nose and throat: No hearing loss, ear pain, nasal congestion, sore throat.  She has dizziness/vertigo. Cardiovascular: No chest pain, palpitations Respiratory: No shortness of breath at rest or with exertion.   No wheezes GastrointestinaI: No nausea, vomiting, diarrhea, abdominal pain, fecal incontinence Genitourinary: No dysuria, urinary retention or frequency.  No nocturia. Musculoskeletal: No neck pain, back pain Integumentary: No rash, pruritus, skin lesions Neurological: as above Psychiatric: No depression at this time.  No anxiety Endocrine: No palpitations, diaphoresis, change in appetite, change in weigh or increased thirst Hematologic/Lymphatic: No anemia, purpura, petechiae. Allergic/Immunologic: No itchy/runny eyes, nasal congestion, recent allergic  reactions, rashes  ALLERGIES: Allergies  Allergen Reactions  . Gabapentin     Severe leg cramps  . Lamotrigine Rash    Chest pain    HOME MEDICATIONS:  Current Outpatient Medications:  .  acetaminophen (TYLENOL) 500 MG tablet, Take 500 mg by mouth every 6 (six) hours as needed for moderate pain., Disp: , Rfl:  .  aspirin EC 81 MG tablet, Take 81 mg by mouth daily., Disp: , Rfl:  .  atorvastatin (LIPITOR) 20 MG tablet, Take 20 mg by mouth daily., Disp: , Rfl:  .  Biotin 99371 MCG TABS, Take 10,000 mcg by mouth daily., Disp: , Rfl:  .  Carboxymethylcellul-Glycerin (LUBRICATING EYE DROPS OP), Place 1 drop into both eyes daily as needed (dry eyes)., Disp: , Rfl:  .  Cholecalciferol (DIALYVITE VITAMIN D 5000) 125 MCG (5000 UT) capsule, Take 10,000 Units by mouth daily., Disp: , Rfl:  .  DULoxetine (CYMBALTA) 60 MG capsule, TAKE 1 CAPSULE BY MOUTH EVERY DAY (Patient taking differently: Take 60 mg by mouth daily. ), Disp: 90 capsule, Rfl: 3 .  lisinopril (ZESTRIL) 20 MG tablet, Take 10-20 mg by mouth See admin instructions. Take 20 mg in the morning and 10 mg at night, Disp: , Rfl:  .  MAXALT-MLT 10 MG disintegrating tablet, Take 1 tablet (10 mg total) by mouth as needed for migraine. May repeat in 2 hours if needed.   BRAND NAME NECESSARY, Disp: 10 tablet, Rfl: 11 .  modafinil (PROVIGIL) 200 MG tablet, Take 1 tablet (200 mg total) by mouth 2 (two) times daily., Disp: 60 tablet, Rfl: 3 .  propranolol (INDERAL) 80 MG tablet, Take 80 mg by mouth 2 (two) times daily., Disp: , Rfl:  .  solifenacin (VESICARE) 10 MG tablet, TAKE 1 TABLET BY MOUTH EVERY DAY, Disp: 90 tablet, Rfl: 1  PAST MEDICAL HISTORY: Past Medical History:  Diagnosis Date  . Dyslipidemia   . HTN (hypertension)   . Migraines   . Multiple sclerosis (HCC)     PAST SURGICAL HISTORY:  FAMILY HISTORY: Family History  Problem Relation Age of Onset  . Heart disease Mother   . Parkinson's disease Father   . Hypertension Father    . Evelene Croon Parkinson White syndrome Father     SOCIAL HISTORY:  Social History   Socioeconomic History  . Marital status: Divorced    Spouse name: Not on file  . Number of children: Not on file  . Years of education: Not on file  . Highest education level: Not on file  Occupational History  . Not on file  Tobacco Use  . Smoking status: Current Every Day Smoker  . Smokeless tobacco: Never Used  Substance and Sexual Activity  . Alcohol use: Not on file  . Drug use: Not on file  . Sexual activity: Not on file  Other Topics Concern  . Not on file  Social History Narrative   Lives alone   Caffeine use: 2 cups every morning (coffee)   Right handed   Social Determinants of Health   Financial Resource Strain:   . Difficulty of Paying Living Expenses: Not on file  Food Insecurity:   . Worried About Running  Out of Food in the Last Year: Not on file  . Ran Out of Food in the Last Year: Not on file  Transportation Needs:   . Lack of Transportation (Medical): Not on file  . Lack of Transportation (Non-Medical): Not on file  Physical Activity:   . Days of Exercise per Week: Not on file  . Minutes of Exercise per Session: Not on file  Stress:   . Feeling of Stress : Not on file  Social Connections:   . Frequency of Communication with Friends and Family: Not on file  . Frequency of Social Gatherings with Friends and Family: Not on file  . Attends Religious Services: Not on file  . Active Member of Clubs or Organizations: Not on file  . Attends Banker Meetings: Not on file  . Marital Status: Not on file  Intimate Partner Violence:   . Fear of Current or Ex-Partner: Not on file  . Emotionally Abused: Not on file  . Physically Abused: Not on file  . Sexually Abused: Not on file     PHYSICAL EXAM  Vitals:   05/10/20 1026  BP: 117/74  Pulse: (!) 59  Weight: 183 lb (83 kg)  Height: 5\' 2"  (1.575 m)    Body mass index is 33.47 kg/m.   General: The  patient is well-developed and well-nourished and in no acute distress  HEENT:  Head is [place]/AT.  Sclera are anicteric.  Funduscopic exam shows normal optic discs and retinal vessels.  Neck: No carotid bruits are noted.  The neck is nontender.  Cardiovascular: The heart has a regular rate and rhythm with a normal S1 and S2. There were no murmurs, gallops or rubs.    Skin: Extremities are without rash or  edema.  Musculoskeletal:  Back is nontender  Neurologic Exam  Mental status: The patient is alert and oriented x 3 at the time of the examination. The patient has apparent normal recent and remote memory, with an apparently normal attention span and concentration ability.   Speech is normal.  Cranial nerves: Extraocular movements are full.  Facial strength is normal.  Trapezius strength is normal.  The tongue is midline, and the patient has symmetric elevation of the soft palate. No obvious hearing deficits are noted.  Motor:  Muscle bulk is normal.   Tone is normal. Strength is  5 / 5 in all 4 extremities.   Sensory: Sensory testing is reduced to touch and temperature on the left vs right.  Coordination: Cerebellar testing reveals good finger-nose-finger and heel-to-shin bilaterally.  Gait and station: Station is normal.   Gait is normal. Tandem gait is mildly wide.. Romberg is negative.   Reflexes: Deep tendon reflexes are symmetric and normal bilaterally.       ASSESSMENT AND PLAN  1. Cerebrovascular accident (CVA), unspecified mechanism (HCC)   2. Enlarged aorta (HCC)   3. White matter abnormality on MRI of brain   4. Essential hypertension   5. Other fatigue   6. Greater trochanteric bursitis of right hip     1.   We discussed that the changes on MRI are more consistent with stroke and other vascular change than to demyelination.  I do not think that she has multiple sclerosis.   2.  continue Vesicare for the bladder dysfunction. 3.   Right trochanteric bursa injection  with 40 mg Depo-Medrol in 3 cc marcaine using sterile technique.  She tolerated the injection well 4.  Brand Maxalt.  If not better  consider an anti-CGRP agent.    5.  She will return to see [place] in 12 months or sooner for new or worsening neurologic symptoms.   Kiyona Mcnall A. Epimenio Foot, MD, PhD, FAAN Certified in Neurology, Clinical Neurophysiology, Sleep Medicine, Pain Medicine and Neuroimaging Director, [facility]  [facility] [address] [place], [place] [address] [phone]

## 2020-05-17 ENCOUNTER — Telehealth: Payer: Self-pay | Admitting: *Deleted

## 2020-05-17 MED ORDER — MAXALT-MLT 10 MG PO TBDP: 10.0000 mg | ORAL_TABLET | ORAL | 0 refills | Status: DC | PRN

## 2020-05-17 NOTE — Telephone Encounter (Addendum)
Received fax that PA Maxalt denied. Pt must try/fail naratiptan or sumatriptan first.   Check online and pt can fill rx using goodrx coupon instead. Cheapest being at [facility] or [facility] for about 12-13 dollars for #10 tabs/30days. Called pt to discuss. She is requesting we send 90 days supply  [phone] Cx rx Maxalt on file there.

## 2020-05-17 NOTE — Addendum Note (Signed)
Addended by: Arther Abbott on: 05/17/2020 01:59 PM   Modules accepted: Orders

## 2020-05-17 NOTE — Telephone Encounter (Signed)
Received fax from [facility] requesting further information. I completed and faxed back to them at [phone]. Received fax confirmation.

## 2020-05-17 NOTE — Telephone Encounter (Addendum)
Submitted PA Maxalt on CMM. Key: [id number] - PA Case ID: [id number]. Waiting on determination from [facility] Medicare Part D.

## 2020-06-15 ENCOUNTER — Other Ambulatory Visit: Payer: Self-pay | Admitting: Neurology

## 2020-06-15 ENCOUNTER — Telehealth: Payer: Self-pay | Admitting: Neurology

## 2020-06-15 NOTE — Telephone Encounter (Signed)
Pt called to check on the status of refill for modafinil (PROVIGIL) 200 MG tablet. Would like a call from the nurse.

## 2020-06-15 NOTE — Telephone Encounter (Signed)
Called pt back. She is out of medication. Advised I will send to MD to send in today for her. She verbalized understanding.

## 2020-08-18 ENCOUNTER — Other Ambulatory Visit: Payer: Self-pay | Admitting: Thoracic Surgery (Cardiothoracic Vascular Surgery)

## 2020-08-18 DIAGNOSIS — I712 Thoracic aortic aneurysm, without rupture, unspecified: Secondary | ICD-10-CM

## 2020-08-19 ENCOUNTER — Other Ambulatory Visit: Payer: Self-pay | Admitting: Neurology

## 2020-10-08 ENCOUNTER — Ambulatory Visit: Payer: Medicare Other | Admitting: Thoracic Surgery (Cardiothoracic Vascular Surgery)

## 2020-10-08 ENCOUNTER — Inpatient Hospital Stay: Admission: RE | Admit: 2020-10-08 | Payer: Medicare Other | Source: Ambulatory Visit

## 2020-12-14 ENCOUNTER — Other Ambulatory Visit: Payer: Self-pay | Admitting: Neurology

## 2021-05-11 ENCOUNTER — Ambulatory Visit: Payer: Medicare Other | Admitting: Neurology

## 2021-06-23 ENCOUNTER — Encounter: Payer: Self-pay | Admitting: Neurology

## 2021-06-23 ENCOUNTER — Ambulatory Visit: Payer: Medicare Other | Admitting: Neurology

## 2021-06-23 ENCOUNTER — Other Ambulatory Visit: Payer: Self-pay

## 2021-06-23 VITALS — BP 108/74 | HR 69 | Ht 62.0 in | Wt 188.0 lb

## 2021-06-23 DIAGNOSIS — R5383 Other fatigue: Secondary | ICD-10-CM

## 2021-06-23 DIAGNOSIS — I639 Cerebral infarction, unspecified: Secondary | ICD-10-CM | POA: Diagnosis not present

## 2021-06-23 DIAGNOSIS — N3941 Urge incontinence: Secondary | ICD-10-CM

## 2021-06-23 MED ORDER — MODAFINIL 200 MG PO TABS: 200.0000 mg | ORAL_TABLET | Freq: Two times a day (BID) | ORAL | 5 refills | Status: DC

## 2021-06-23 MED ORDER — SUMATRIPTAN SUCCINATE 100 MG PO TABS: 100.0000 mg | ORAL_TABLET | ORAL | 11 refills | Status: DC | PRN

## 2021-06-23 NOTE — Progress Notes (Signed)
[facility]  PATIENT: Yvonne Page DOB: [birth year]  REFERRING DOCTOR OR PCP:  Erlene Senters (fax: [phone]) SOURCE: patient, notes from Dr. Arline Asp, will review MRI if available  _________________________________   HISTORICAL  CHIEF COMPLAINT:  Chief Complaint  Patient presents with   Follow-up    Rm 1, alone. Here for yearly f/u. Pt reports doing well. Pt has an abscess near her tooth and wanted to see if something can be given today.     HISTORY OF PRESENT ILLNESS:  Yvonne Page is a 61 y.o. woman who had been diagnosed with MS in the past but has evidence of strokes on MRI.  Update 05/10/2020: She is doing well in general.    No new neurologic symptoms.  She still gets some vertigo at times.   She gets migraines, about 4-6 a month.  They were helped by Maxalt 10 mg.  Unfortunately, the generic did not help as much as the brand name.     She has an abnormal brain MRI.   We discussed that MRI changes are more c/w strokes than MS. specifically, there is a large right temporoparietal stroke that involves both gray and white matter and another stroke in the left caudate/corona radiata with hemosiderin deposition.  There are some scattered T2/FLAIR hyperintense foci that are nonspecific.  She is on aspirin 81 mg.  TEE 09/03/2019 showed normal EF%, no source of cardioemboli,no interratrial shunt.     Holter 04/06/2020 showed some short runs of tachycardia.   She has a small thoracic aortic aneurysm  (3.8 cm).  CTA showed mild distal aorta dilatation (3.0 x 2.7).   She also had a benign appearing adrenal mass, likely adenoma.     The Vesicare helped her bladder function   Because the the right temporoparietal stroke was potentially worrisome for an embolic etiology, additional studies were done.  Echocardiogram with bubble contrast 01/21/2019 and carotid Dopplers were essentially normal as far as the heart but the aorta was enlarged.       Vascular risks:   Hypertension.  Smokes 1/2 ppd, Hyperlipidemia.   She does not recall having an Echo or carotid doppler.  She takes an 81 mg aspirin daily  She reports some dental issues lately.  She has had small fractures in her teeth and had an abscess leading to extractions.  An implant is being considered bt due to bone loss she may not be able to.      [place] part of the [place] near [place] Imaging:  MRI of the brain and cervical spine performed 11/11/2014 were personally reviewed.  The MRI of the brain shows a right temporal parietal stroke involving gray and white matter.  This could be embolic in origin.  Additionally, there is a second stroke adjacent to the caudate the corona radiata on the left.  There are a few T2/flair hyperintense foci, one is juxtacortical on the left and others are subcortical deep white matter or pericallosal.  There are no foci in the posterior fossa or the spinal cord on the cervical spine MRI.  However, the cervical spine MRI does show mild spinal stenosis at C5-C6 associated with moderately severe foraminal narrowing.  MRI of the brain and cervical spine from 02/08/2019 show Multiple strokes involving the right temporoparietal region, left basal ganglia and adjacent white matter and left corona radiata.  Additionally, hemosiderin is noted within the left caudate head stroke.  These are unchanged  compared to the 2016 MRI.  These do not appear to be demyelinating. .   Additionally, there are some scattered T2/flair hyperintense foci in the deep white matter and left thalamus most consistent with mild chronic microvascular ischemic change.  Demyelination would be less likely though cannot be completely excluded..   There are no acute findings.  REVIEW OF SYSTEMS: Constitutional: No fevers, chills, sweats, or change in appetite.  She has fatigue Eyes: No visual changes, double vision, eye pain Ear, nose and throat: No hearing loss, ear pain,  nasal congestion, sore throat.  She has dizziness/vertigo. Cardiovascular: No chest pain, palpitations Respiratory:  No shortness of breath at rest or with exertion.   No wheezes GastrointestinaI: No nausea, vomiting, diarrhea, abdominal pain, fecal incontinence Genitourinary:  No dysuria, urinary retention or frequency.  No nocturia. Musculoskeletal:  No neck pain, back pain Integumentary: No rash, pruritus, skin lesions Neurological: as above Psychiatric: No depression at this time.  No anxiety Endocrine: No palpitations, diaphoresis, change in appetite, change in weigh or increased thirst Hematologic/Lymphatic:  No anemia, purpura, petechiae. Allergic/Immunologic: No itchy/runny eyes, nasal congestion, recent allergic reactions, rashes  ALLERGIES: Allergies  Allergen Reactions   Gabapentin     Severe leg cramps   Lamotrigine Rash    Chest pain    HOME MEDICATIONS:  Current Outpatient Medications:    acetaminophen (TYLENOL) 500 MG tablet, Take 500 mg by mouth every 6 (six) hours as needed for moderate pain., Disp: , Rfl:    aspirin EC 81 MG tablet, Take 81 mg by mouth daily., Disp: , Rfl:    atorvastatin (LIPITOR) 20 MG tablet, Take 20 mg by mouth daily., Disp: , Rfl:    Biotin 13244 MCG TABS, Take 10,000 mcg by mouth daily., Disp: , Rfl:    Carboxymethylcellul-Glycerin (LUBRICATING EYE DROPS OP), Place 1 drop into both eyes daily as needed (dry eyes)., Disp: , Rfl:    Cholecalciferol (DIALYVITE VITAMIN D 5000) 125 MCG (5000 UT) capsule, Take 10,000 Units by mouth daily., Disp: , Rfl:    DULoxetine (CYMBALTA) 60 MG capsule, TAKE 1 CAPSULE BY MOUTH EVERY DAY, Disp: 90 capsule, Rfl: 3   lisinopril (ZESTRIL) 20 MG tablet, Take 10-20 mg by mouth See admin instructions. Take 20 mg in the morning and 10 mg at night, Disp: , Rfl:    MAXALT-MLT 10 MG disintegrating tablet, Take 1 tablet (10 mg total) by mouth as needed for migraine. May repeat in 2 hours if needed.   BRAND NAME NECESSARY,  Disp: 30 tablet, Rfl: 0   modafinil (PROVIGIL) 200 MG tablet, TAKE 1 TABLET BY MOUTH TWICE A DAY, Disp: 60 tablet, Rfl: 5   propranolol (INDERAL) 80 MG tablet, Take 80 mg by mouth 2 (two) times daily., Disp: , Rfl:    solifenacin (VESICARE) 10 MG tablet, TAKE 1 TABLET BY MOUTH EVERY DAY, Disp: 90 tablet, Rfl: 1  PAST MEDICAL HISTORY: Past Medical History:  Diagnosis Date   Dyslipidemia    HTN (hypertension)    Migraines    Multiple sclerosis (HCC)     PAST SURGICAL HISTORY:  FAMILY HISTORY: Family History  Problem Relation Age of Onset   Heart disease Mother    Parkinson's disease Father    Hypertension Father    Evelene Croon Parkinson White syndrome Father     SOCIAL HISTORY:  Social History   Socioeconomic History   Marital status: Divorced    Spouse name: Not on file   Number of children: Not on file  Years of education: Not on file   Highest education level: Not on file  Occupational History   Not on file  Tobacco Use   Smoking status: Every Day   Smokeless tobacco: Never  Substance and Sexual Activity   Alcohol use: Not on file   Drug use: Not on file   Sexual activity: Not on file  Other Topics Concern   Not on file  Social History Narrative   Lives alone   Caffeine use: 2 cups every morning (coffee)   Right handed   Social Determinants of Health   Financial Resource Strain: Not on file  Food Insecurity: Not on file  Transportation Needs: Not on file  Physical Activity: Not on file  Stress: Not on file  Social Connections: Not on file  Intimate Partner Violence: Not on file     PHYSICAL EXAM  Vitals:   06/23/21 1533  BP: 108/74  Pulse: 69  SpO2: 97%  Weight: 188 lb (85.3 kg)  Height: 5\' 2"  (1.575 m)    Body mass index is 34.39 kg/m.   General: The patient is well-developed and well-nourished and in no acute distress  HEENT:  Head is [place]/AT.  Sclera are anicteric.    Neck: No carotid bruits are noted.  The neck is  nontender.  Cardiovascular: The heart has a regular rate and rhythm with a normal S1 and S2. There were no murmurs, gallops or rubs.    Skin: Extremities are without rash or  edema.  Neurologic Exam  Mental status: The patient is alert and oriented x 3 at the time of the examination. The patient has apparent normal recent and remote memory, with an apparently normal attention span and concentration ability.   Speech is normal.  Cranial nerves: Extraocular movements are full.  Facial strength is normal.   No obvious hearing deficits are noted.  Motor:  Muscle bulk is normal.   Tone is normal. Strength is  5 / 5 in all 4 extremities.   Sensory: Sensory testing is reduced to touch and temperature on the right vs left but vibration is symmetric.     Coordination: Cerebellar testing reveals good finger-nose-finger and heel-to-shin bilaterally.  Gait and station: Station is normal.   Gait is normal.  The tandem gait is wide.. Romberg is negative.   Reflexes: Deep tendon reflexes are symmetric and normal bilaterally.       ASSESSMENT AND PLAN  No diagnosis found.   1.   I showed her the MRI images.  Changes on MRI are more consistent with stroke and other vascular change than to demyelination.  I do not think that she has multiple sclerosis.   2.  continue Vesicare for the bladder dysfunction. 3.   Change Maxalt to sumatriptan.   If not better consider topiramate or an anti-CGRP agent.    5.  She will return to see in 12 months or sooner for new or worsening neurologic symptoms.   Darin Arndt A. [place], MD, PhD, FAAN Certified in Neurology, Clinical Neurophysiology, Sleep Medicine, Pain Medicine and Neuroimaging Director, [facility] at [facility]  [facility] [address], [address] [place], [place] [phone]

## 2021-06-27 ENCOUNTER — Telehealth: Payer: Self-pay

## 2021-06-27 NOTE — Telephone Encounter (Signed)
I have submitted a PA request on CMM, Key: [id number] - PA Case ID: [id number]. Awaiting determination.

## 2021-06-27 NOTE — Telephone Encounter (Signed)
Request Reference Number: [id number]. MODAFINIL TAB 200MG  is approved through 12/25/2021. Your patient may now fill this prescription and it will be covered.

## 2021-07-17 ENCOUNTER — Other Ambulatory Visit: Payer: Self-pay | Admitting: Neurology

## 2021-08-09 ENCOUNTER — Other Ambulatory Visit: Payer: Self-pay | Admitting: Neurology

## 2021-08-09 MED ORDER — MODAFINIL 200 MG PO TABS: 200.0000 mg | ORAL_TABLET | Freq: Two times a day (BID) | ORAL | 0 refills | Status: DC

## 2021-08-09 NOTE — Telephone Encounter (Signed)
Received refill request for modafinil.  Last OV was on 06/23/21.  Next OV is scheduled for 06/15/22 .  Last RX was written on 06/27/21 for 60 tabs.    Drug Database has been reviewed.

## 2021-08-09 NOTE — Telephone Encounter (Signed)
Pt request refill for modafinil (PROVIGIL) [facility] MG tablet [id number]   Pt want to make sure medication sent to [id number] [place]

## 2021-09-07 ENCOUNTER — Other Ambulatory Visit: Payer: Self-pay | Admitting: Neurology

## 2021-09-12 ENCOUNTER — Other Ambulatory Visit: Payer: Self-pay | Admitting: Neurology

## 2021-09-12 NOTE — Telephone Encounter (Signed)
Last OV was on 06/23/21.  ?Next OV is scheduled for 06/15/22.  ?Last RX was written on 06/27/21 for 60 tabs.  ? ?[place] Drug Database has been reviewed.  ?

## 2021-10-11 ENCOUNTER — Other Ambulatory Visit: Payer: Self-pay | Admitting: Neurology

## 2021-10-25 IMAGING — CT CT ABDOMEN W/O CM
2 of 4 series · 13 of 46 positions shown, 15 images · non-contrast
Comparison: CT chest including upper abdomen October 03, 2019

CLINICAL DATA: Left adrenal mass

EXAM:
CT ABDOMEN WITHOUT CONTRAST
TECHNIQUE: Multidetector CT imaging of the abdomen was performed following the
standard protocol without IV contrast.

[Series 2: adrenals 2.00 br40 s3 axial st · axial · 0.74mm/px · z∈[+1363,+1555]mm · 10 of 112 slices shown, 12 images]
[im 8/112  soft-tissue]
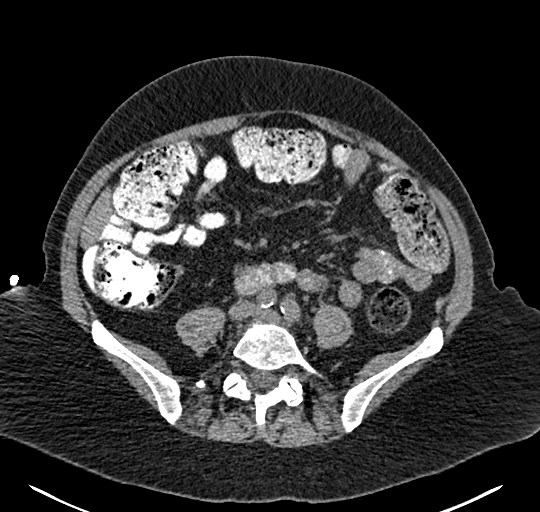
[im 8/112  bone]
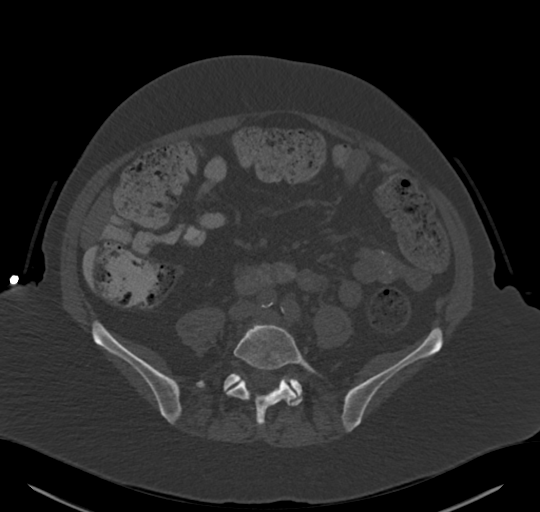
[im 23/112  soft-tissue]
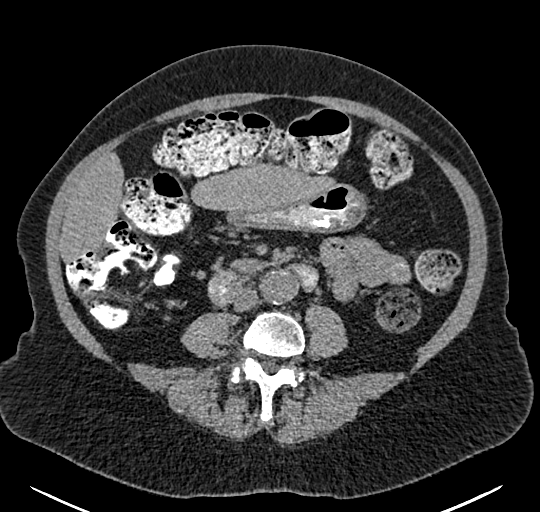
[im 30/112  soft-tissue]
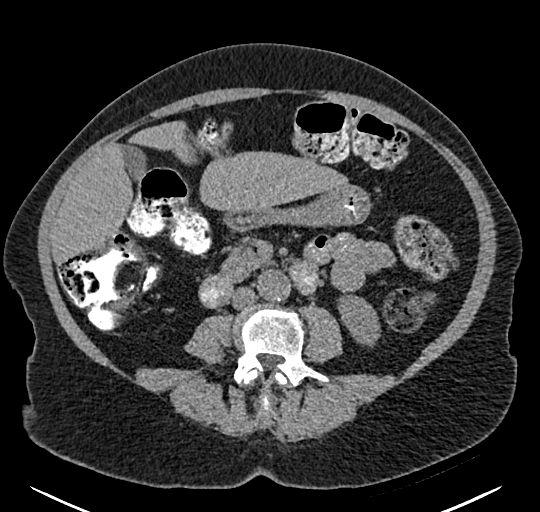
[im 38/112  soft-tissue]
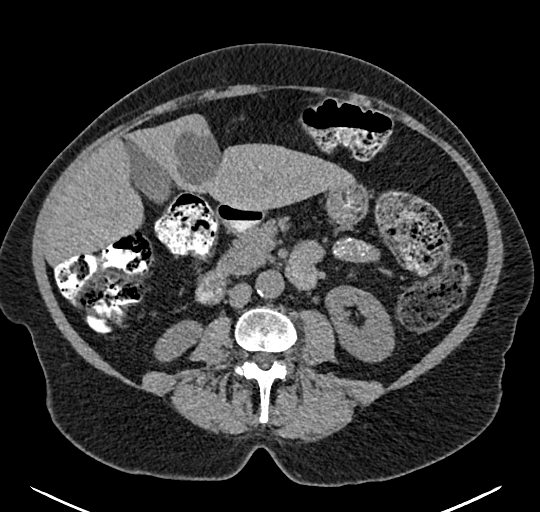
[im 52/112  soft-tissue]
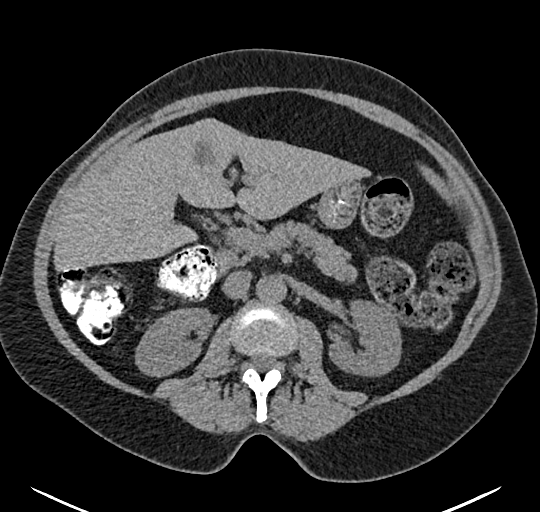
[im 60/112  soft-tissue]
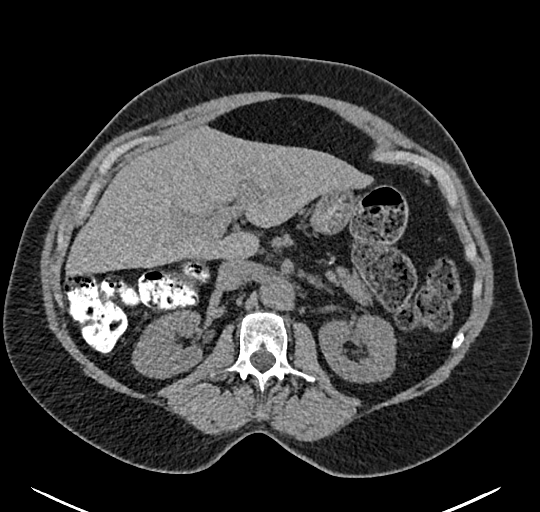
[im 75/112  soft-tissue]
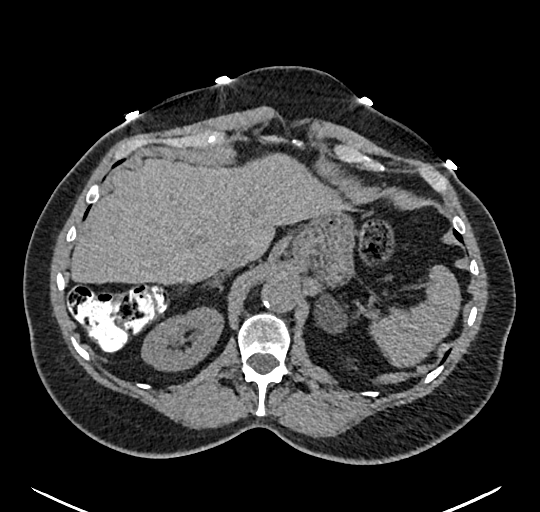
[im 82/112  soft-tissue]
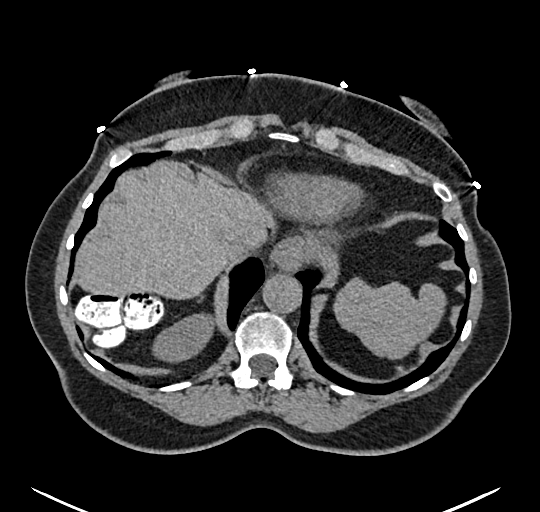
[im 89/112  soft-tissue]
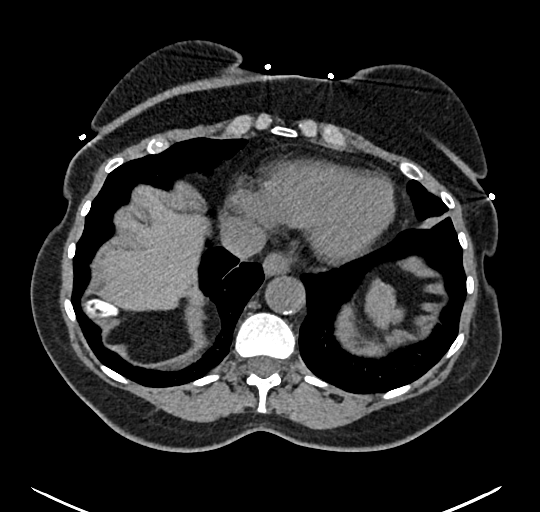
[im 89/112  bone]
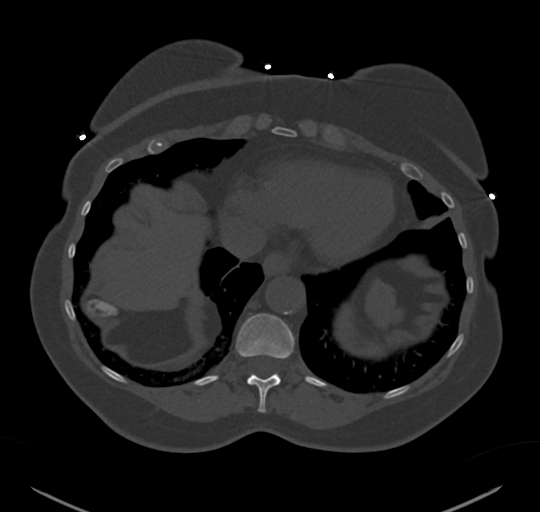
[im 104/112  soft-tissue]
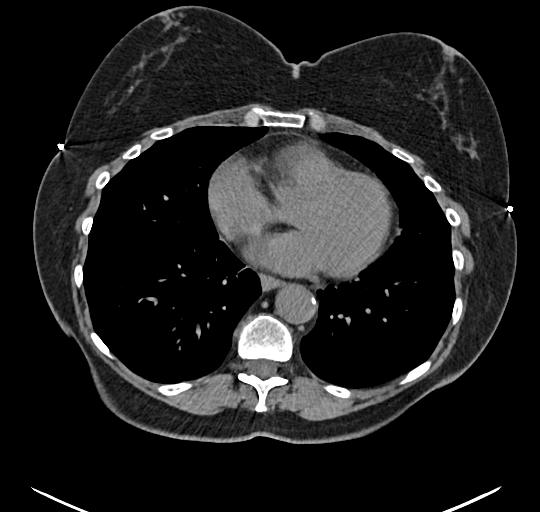

[Series 4: adrenals 2.00 br40 s3 coronal · coronal · 0.44mm/px · 3 of 189 slices shown]
[im 63/189  soft-tissue]
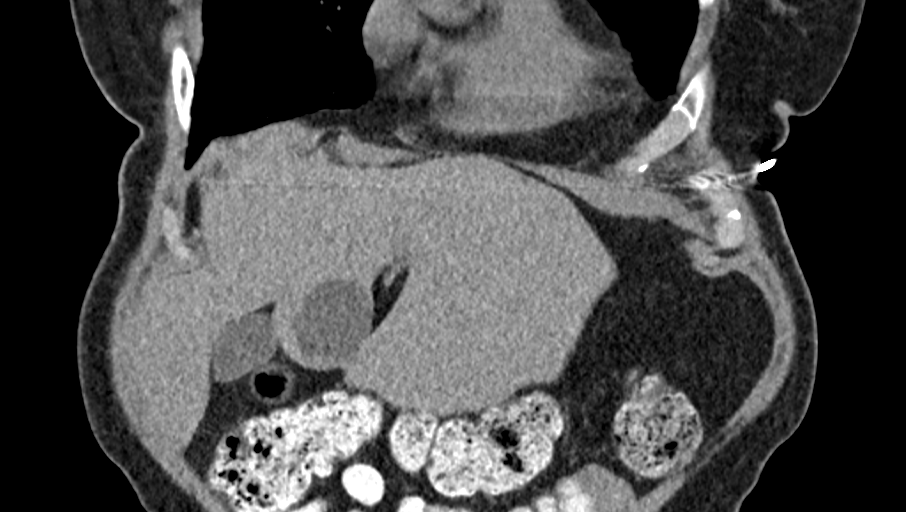
[im 84/189  soft-tissue]
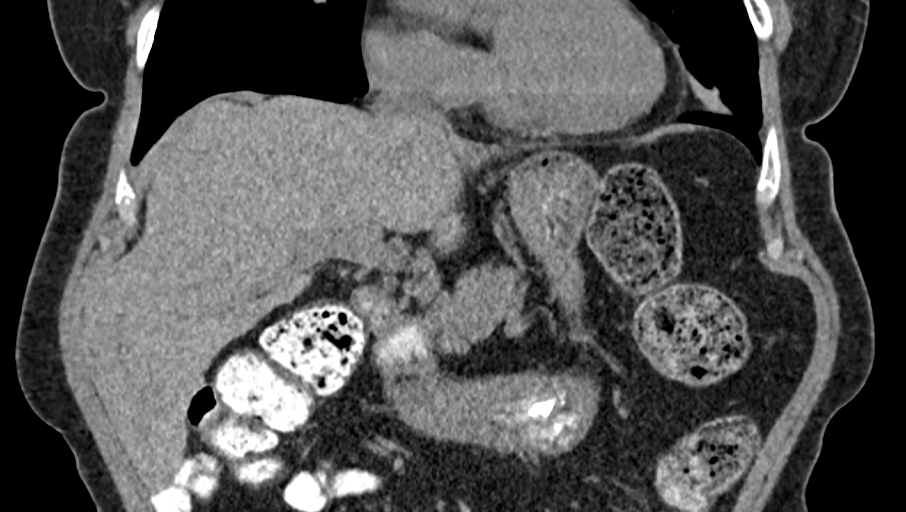
[im 105/189  soft-tissue]
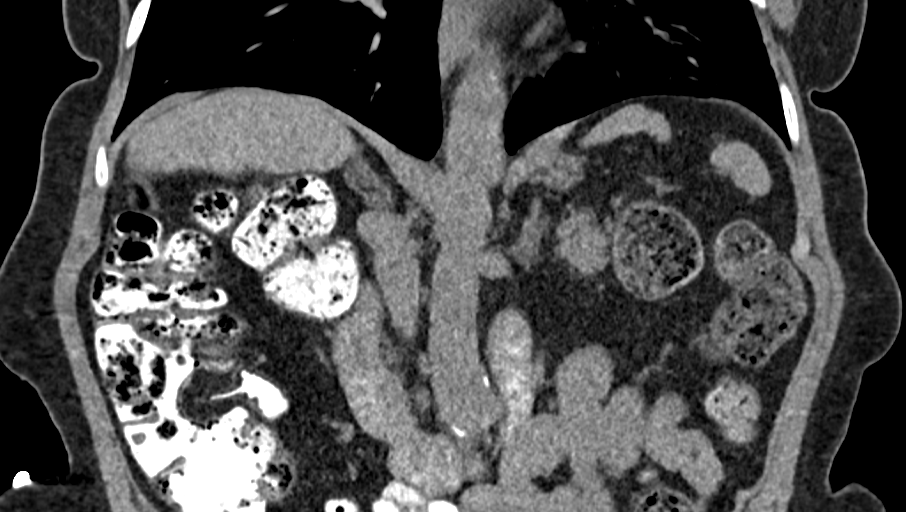

[13 of 46 positions shown; findings below may reference images not displayed]

FINDINGS: Lower chest: There is bibasilar atelectatic change. No edema or
airspace opacity in the lung bases. There is a small hiatal hernia.

Hepatobiliary: There is a cyst in the liver immediately to the right
of the fissure for the ligamentum teres measuring 4.1 x 3.5 cm. No
other focal liver lesions are evident. Gallbladder wall is not
appreciably thickened. There is no biliary duct dilatation.

Pancreas: No evident pancreatic mass or inflammatory focus.

Spleen: No splenic lesions are evident.

Adrenals/Urinary Tract: Right adrenal appears normal. There is a
mass arising in the left adrenal measuring 3.5 x 3.0 x 2.4 cm. This
mass has attenuation values of less than 10 Hounsfield units,
consistent with a benign adenoma. Kidneys bilaterally show no
evident mass or hydronephrosis on either side. There is no renal or
proximal ureteral calculus on either side.

Stomach/Bowel: There is no appreciable bowel wall or mesenteric
thickening. There is moderate stool in colon. No bowel obstruction.
No free air or portal venous air is evident. Note that the terminal
ileum appears normal. There is fatty infiltration throughout the
ileocecal valve.

Vascular/Lymphatic: There is focal mild dilatation in the distal
abdominal aorta with a maximum transverse diameter of 3.0 x 2.7 cm.
There is aortic and common iliac artery atherosclerosis. No
adenopathy in the abdomen or upper pelvis.

Other: No abscess or ascites in the abdomen or upper pelvis. There
is mild fat in the umbilicus.

Musculoskeletal: No blastic or lytic bone lesions. No intramuscular
or abdominal wall lesions evident.
IMPRESSION: 1. Apparent benign left adrenal adenoma based on attenuation values
measuring 3.5 x 3.0 x 2.4 cm. Right adrenal appears normal.

2. Mild dilatation of the distal aorta. Recommend followup by
ultrasound in 3 years. This recommendation follows ACR consensus
guidelines: White Paper of the ACR Incidental Findings Committee II
aortic and iliac artery atherosclerosis.

3. Lipomatous infiltration of the ileocecal valve. This is a finding
of questionable clinical significance, although there have been
reports of abdominal pain associated with this entity. No bowel
obstruction in the abdomen or upper pelvis noted. No abscess in the
abdomen or pelvis.

4.  Small hiatal hernia.

5. No renal or proximal ureteral calculus. No hydronephrosis on
either side.

## 2022-01-20 ENCOUNTER — Other Ambulatory Visit: Payer: Self-pay | Admitting: Neurology

## 2022-01-20 NOTE — Telephone Encounter (Signed)
Pt called stating that she is needing a refill on her modafinil (PROVIGIL) 200 MG tablet and that she is completely out but her pharmacy informed her that her PA has expired and is needing to be sent in again. Pt would like to know if this can be done soon due to the fact that she has been out of the medication for a week. Pt states pharmacy has sent in the request days ago. Please advise.

## 2022-01-23 ENCOUNTER — Other Ambulatory Visit: Payer: Self-pay | Admitting: Neurology

## 2022-01-23 MED ORDER — MODAFINIL 200 MG PO TABS: 200.0000 mg | ORAL_TABLET | Freq: Two times a day (BID) | ORAL | 5 refills | Status: DC

## 2022-01-23 NOTE — Telephone Encounter (Signed)
PA approved through [employer]  Request Reference Number: [id number]. MODAFINIL TAB 200MG  is approved through 07/26/2022.

## 2022-01-23 NOTE — Telephone Encounter (Signed)
Refill send to Dr Epimenio Foot for the pt to be sent to [facility]. Will complete PA through CMM/[employer] rx KEY:  [id number] Placed as a expedited referral. Will await determination

## 2022-03-12 ENCOUNTER — Other Ambulatory Visit: Payer: Self-pay | Admitting: Neurology

## 2022-06-07 ENCOUNTER — Ambulatory Visit: Payer: Medicare Other | Admitting: Neurology

## 2022-06-07 ENCOUNTER — Other Ambulatory Visit: Payer: Self-pay | Admitting: Neurology

## 2022-06-15 ENCOUNTER — Ambulatory Visit: Payer: Medicare Other | Admitting: Neurology

## 2022-07-06 ENCOUNTER — Other Ambulatory Visit: Payer: Self-pay | Admitting: Neurology

## 2022-07-06 NOTE — Telephone Encounter (Unsigned)
Follow up appointment scheduled on 11/23/22.

## 2022-07-22 ENCOUNTER — Other Ambulatory Visit: Payer: Self-pay | Admitting: Neurology

## 2022-08-08 ENCOUNTER — Other Ambulatory Visit: Payer: Self-pay | Admitting: Neurology

## 2022-08-08 MED ORDER — MODAFINIL 200 MG PO TABS: 200.0000 mg | ORAL_TABLET | Freq: Two times a day (BID) | ORAL | 3 refills | Status: DC

## 2022-08-08 MED ORDER — MODAFINIL 200 MG PO TABS: 200.0000 mg | ORAL_TABLET | Freq: Two times a day (BID) | ORAL | 5 refills | Status: DC

## 2022-08-08 NOTE — Telephone Encounter (Signed)
Pt has a scheduled f/u and would now like to request a refill for her modafinil (PROVIGIL) 200 MG tablet  [id number]

## 2022-08-08 NOTE — Telephone Encounter (Signed)
Pt last seen 06/23/21. Next f/u 11/23/22 (soonest available). Per drug registry, last refilled 01/16/124 #60

## 2022-08-15 MED ORDER — MODAFINIL 200 MG PO TABS: 200.0000 mg | ORAL_TABLET | Freq: Two times a day (BID) | ORAL | 5 refills | Status: DC

## 2022-08-15 NOTE — Telephone Encounter (Signed)
Pt called and requested that  modafinil (PROVIGIL) 200 MG tablet  be  resent [id number] [place], [place] Pt said medication was sent to wrong pharmacy the first time.

## 2022-08-15 NOTE — Addendum Note (Signed)
Addended by: Wyvonnia Lora on: 08/15/2022 02:43 PM   Modules accepted: Orders

## 2022-11-23 ENCOUNTER — Ambulatory Visit: Payer: Medicare Other | Admitting: Neurology

## 2023-02-28 ENCOUNTER — Other Ambulatory Visit: Payer: Self-pay | Admitting: Neurology

## 2023-02-28 NOTE — Telephone Encounter (Signed)
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

## 2023-03-22 ENCOUNTER — Other Ambulatory Visit: Payer: Self-pay | Admitting: Neurology

## 2023-03-22 NOTE — Telephone Encounter (Signed)
Last seen on 06/23/21 Follow up scheduled on 06/11/23

## 2023-04-06 ENCOUNTER — Other Ambulatory Visit: Payer: Self-pay | Admitting: Neurology

## 2023-04-16 ENCOUNTER — Other Ambulatory Visit: Payer: Self-pay | Admitting: Neurology

## 2023-04-16 MED ORDER — MODAFINIL 200 MG PO TABS: 200.0000 mg | ORAL_TABLET | Freq: Two times a day (BID) | ORAL | 5 refills | Status: AC

## 2023-04-16 MED ORDER — MODAFINIL 200 MG PO TABS: 200.0000 mg | ORAL_TABLET | Freq: Two times a day (BID) | ORAL | 1 refills | Status: DC

## 2023-04-16 NOTE — Addendum Note (Signed)
Addended by: Aura Camps on: 04/16/2023 07:57 AM   Modules accepted: Orders

## 2023-04-16 NOTE — Telephone Encounter (Signed)
Dr.Sater can you sign Rx again, it was sent on print when you approved on 04/12/23. Rx pending to be signed. I have placed the printed copy to shred.   Thanks!

## 2023-04-18 MED ORDER — MODAFINIL 200 MG PO TABS: 200.0000 mg | ORAL_TABLET | Freq: Two times a day (BID) | ORAL | 1 refills | Status: DC

## 2023-04-18 NOTE — Telephone Encounter (Signed)
MODAFINIL TAB 200MG  [facility] [address] in [place] and please delete the pharmacies in [place] out of her chart. She said its been sent to the wrong pharmacy multiple times and would like them taken out of her chart so it doesn't keep happening.

## 2023-04-18 NOTE — Telephone Encounter (Addendum)
Pt has not been seen since 06/23/21. Has f/u scheduled 06/11/23. I called pt. She states Dr. Epimenio Foot sent in refill 04/16/23, just needing him to sent to the correct pharmacy.  I went over office policy for future that she needs to be seen once a yr at least for med refills. She states Dr. Epimenio Foot said it would be ok if it wasn't once a yr. Aware I will send to him.  [phone]. Cx rx sent in on 04/16/23.

## 2023-04-18 NOTE — Addendum Note (Signed)
Addended by: Arther Abbott on: 04/18/2023 12:08 PM   Modules accepted: Orders

## 2023-05-20 ENCOUNTER — Other Ambulatory Visit: Payer: Self-pay | Admitting: Neurology

## 2023-05-22 NOTE — Telephone Encounter (Signed)
Last seen on 06/23/21 Follow up scheduled on 06/11/23 Last filled on 05/16/23 #60 tablets (30 day supply) Rx has been refilled for this month Rx pending to be signed  Please deny

## 2023-05-23 ENCOUNTER — Other Ambulatory Visit: Payer: Self-pay | Admitting: Neurology

## 2023-05-23 NOTE — Telephone Encounter (Signed)
Patient scheduled on 06/23/21 Follow up scheduled on 06/11/23  Rx last filled on 05/11/23 for 90 day supply

## 2023-06-07 ENCOUNTER — Ambulatory Visit: Payer: Medicare Other | Admitting: Neurology

## 2023-06-11 ENCOUNTER — Ambulatory Visit: Payer: Medicare Other | Admitting: Neurology

## 2023-06-11 ENCOUNTER — Encounter: Payer: Self-pay | Admitting: Neurology

## 2023-06-17 ENCOUNTER — Other Ambulatory Visit: Payer: Self-pay | Admitting: Neurology

## 2023-06-19 NOTE — Telephone Encounter (Signed)
 Last seen on 06/23/21 per note " She will return to see [place] in 12 months " No follow up scheduled

## 2023-07-13 ENCOUNTER — Other Ambulatory Visit: Payer: Self-pay | Admitting: Neurology

## 2023-08-09 ENCOUNTER — Other Ambulatory Visit: Payer: Self-pay | Admitting: Neurology

## 2023-08-30 ENCOUNTER — Other Ambulatory Visit: Payer: Self-pay | Admitting: Neurology

## 2023-08-30 NOTE — Telephone Encounter (Signed)
 Requested Prescriptions   Pending Prescriptions Disp Refills   SUMAtriptan (IMITREX) 100 MG tablet [Pharmacy Med Name: SUMATRIPTAN SUCC 100 MG TABLET] 10 tablet 2    Sig: TAKE 1 TAB AS NEEDED FOR MIGRAINE, MAY REPEAT IN 2 HRS ID HEADACHE OERSISTS OR RETURNS   DULoxetine (CYMBALTA) 60 MG capsule [Pharmacy Med Name: DULOXETINE HCL DR 60 MG CAP] 90 capsule 3    Sig: TAKE 1 CAPSULE BY MOUTH EVERY DAY   modafinil (PROVIGIL) 200 MG tablet [Pharmacy Med Name: MODAFINIL 200 MG TABLET] 60 tablet 1    Sig: TAKE 1 TABLET BY MOUTH IN THE MORNING AND IN THE EVENING   Last seen1/12/23, next appt not scheduled, refused medications because pt needs appt

## 2023-10-01 ENCOUNTER — Other Ambulatory Visit: Payer: Self-pay | Admitting: Neurology

## 2023-12-04 ENCOUNTER — Other Ambulatory Visit: Payer: Self-pay | Admitting: Neurology

## 2023-12-04 NOTE — Telephone Encounter (Signed)
Last seen on 06/23/21 No follow up scheduled

## 2024-03-10 ENCOUNTER — Other Ambulatory Visit: Payer: Self-pay | Admitting: Neurology
